# Patient Record
Sex: Male | Born: 1955 | Race: White | Hispanic: No | Smoking: Current every day smoker
Health system: Southern US, Community
[De-identification: ages and names within clinical notes are randomized; demographics above are authoritative.]

## PROBLEM LIST (undated history)

## (undated) DIAGNOSIS — Z86010 Personal history of colonic polyps: Secondary | ICD-10-CM

## (undated) DIAGNOSIS — L309 Dermatitis, unspecified: Secondary | ICD-10-CM

## (undated) DIAGNOSIS — F32A Depression, unspecified: Secondary | ICD-10-CM

## (undated) DIAGNOSIS — Z720 Tobacco use: Secondary | ICD-10-CM

## (undated) DIAGNOSIS — F329 Major depressive disorder, single episode, unspecified: Secondary | ICD-10-CM

## (undated) DIAGNOSIS — E785 Hyperlipidemia, unspecified: Secondary | ICD-10-CM

## (undated) HISTORY — DX: Depression, unspecified: F32.A

## (undated) HISTORY — DX: Major depressive disorder, single episode, unspecified: F32.9

## (undated) HISTORY — DX: Tobacco use: Z72.0

## (undated) HISTORY — DX: Dermatitis, unspecified: L30.9

## (undated) HISTORY — DX: Personal history of colonic polyps: Z86.010

## (undated) HISTORY — DX: Hyperlipidemia, unspecified: E78.5

## (undated) HISTORY — PX: COLONOSCOPY: SHX174

---
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
## 1991-08-02 HISTORY — PX: INGUINAL HERNIA REPAIR: SUR1180

## 2001-02-16 ENCOUNTER — Encounter: Admission: RE | Admit: 2001-02-16 | Discharge: 2001-05-17 | Payer: Self-pay | Admitting: Family Medicine

## 2002-03-26 ENCOUNTER — Encounter: Admission: RE | Admit: 2002-03-26 | Discharge: 2002-03-26 | Payer: Self-pay | Admitting: Family Medicine

## 2002-03-26 ENCOUNTER — Encounter: Payer: Self-pay | Admitting: Family Medicine

## 2002-04-24 ENCOUNTER — Other Ambulatory Visit: Admission: RE | Admit: 2002-04-24 | Discharge: 2002-04-24 | Payer: Self-pay | Admitting: Urology

## 2003-05-13 ENCOUNTER — Encounter: Payer: Self-pay | Admitting: Family Medicine

## 2003-05-13 ENCOUNTER — Encounter: Admission: RE | Admit: 2003-05-13 | Discharge: 2003-05-13 | Payer: Self-pay | Admitting: Family Medicine

## 2004-01-25 ENCOUNTER — Emergency Department (HOSPITAL_COMMUNITY): Admission: EM | Admit: 2004-01-25 | Discharge: 2004-01-25 | Payer: Self-pay | Admitting: Internal Medicine

## 2004-06-17 ENCOUNTER — Ambulatory Visit: Payer: Self-pay | Admitting: Family Medicine

## 2005-03-11 ENCOUNTER — Ambulatory Visit: Payer: Self-pay | Admitting: Family Medicine

## 2005-03-13 ENCOUNTER — Emergency Department (HOSPITAL_COMMUNITY): Admission: EM | Admit: 2005-03-13 | Discharge: 2005-03-13 | Payer: Self-pay | Admitting: Emergency Medicine

## 2005-03-18 ENCOUNTER — Ambulatory Visit: Payer: Self-pay | Admitting: Family Medicine

## 2005-04-01 ENCOUNTER — Ambulatory Visit: Payer: Self-pay | Admitting: Family Medicine

## 2005-05-12 ENCOUNTER — Ambulatory Visit: Payer: Self-pay | Admitting: Family Medicine

## 2005-05-19 ENCOUNTER — Ambulatory Visit: Payer: Self-pay | Admitting: Family Medicine

## 2005-05-27 ENCOUNTER — Encounter: Admission: RE | Admit: 2005-05-27 | Discharge: 2005-05-27 | Payer: Self-pay | Admitting: Family Medicine

## 2005-06-29 ENCOUNTER — Ambulatory Visit: Payer: Self-pay | Admitting: Internal Medicine

## 2005-12-22 ENCOUNTER — Ambulatory Visit: Payer: Self-pay | Admitting: Family Medicine

## 2006-01-31 ENCOUNTER — Ambulatory Visit: Payer: Self-pay | Admitting: Family Medicine

## 2006-02-23 ENCOUNTER — Ambulatory Visit: Payer: Self-pay | Admitting: Family Medicine

## 2006-03-16 ENCOUNTER — Ambulatory Visit: Payer: Self-pay | Admitting: Internal Medicine

## 2006-05-03 ENCOUNTER — Ambulatory Visit: Payer: Self-pay | Admitting: Family Medicine

## 2006-05-09 ENCOUNTER — Ambulatory Visit: Payer: Self-pay | Admitting: Family Medicine

## 2006-05-15 ENCOUNTER — Encounter: Admission: RE | Admit: 2006-05-15 | Discharge: 2006-05-15 | Payer: Self-pay | Admitting: Family Medicine

## 2006-11-23 ENCOUNTER — Ambulatory Visit: Payer: Self-pay | Admitting: Family Medicine

## 2007-03-29 DIAGNOSIS — E785 Hyperlipidemia, unspecified: Secondary | ICD-10-CM

## 2007-03-29 DIAGNOSIS — R319 Hematuria, unspecified: Secondary | ICD-10-CM

## 2007-03-29 DIAGNOSIS — F17200 Nicotine dependence, unspecified, uncomplicated: Secondary | ICD-10-CM

## 2007-05-11 ENCOUNTER — Ambulatory Visit: Payer: Self-pay | Admitting: Family Medicine

## 2007-05-11 LAB — CONVERTED CEMR LAB
ALT: 30 units/L (ref 0–53)
AST: 33 units/L (ref 0–37)
Basophils Relative: 0.8 % (ref 0.0–1.0)
Bilirubin, Direct: 0.2 mg/dL (ref 0.0–0.3)
CO2: 28 meq/L (ref 19–32)
Calcium: 8.6 mg/dL (ref 8.4–10.5)
Chloride: 108 meq/L (ref 96–112)
Eosinophils Absolute: 0.5 10*3/uL (ref 0.0–0.6)
Eosinophils Relative: 6.6 % — ABNORMAL HIGH (ref 0.0–5.0)
GFR calc Af Amer: 102 mL/min
GFR calc non Af Amer: 84 mL/min
Glucose, Bld: 92 mg/dL (ref 70–99)
Glucose, Urine, Semiquant: NEGATIVE
HCT: 43.7 % (ref 39.0–52.0)
Lymphocytes Relative: 23.4 % (ref 12.0–46.0)
MCV: 92.9 fL (ref 78.0–100.0)
Neutro Abs: 4.8 10*3/uL (ref 1.4–7.7)
Neutrophils Relative %: 62.1 % (ref 43.0–77.0)
Nitrite: NEGATIVE
PSA: 1.09 ng/mL (ref 0.10–4.00)
Platelets: 246 10*3/uL (ref 150–400)
Sodium: 141 meq/L (ref 135–145)
Specific Gravity, Urine: 1.015
Total Protein: 6.1 g/dL (ref 6.0–8.3)
VLDL: 14 mg/dL (ref 0–40)
WBC Urine, dipstick: NEGATIVE
WBC: 7.8 10*3/uL (ref 4.5–10.5)
pH: 6

## 2007-05-18 ENCOUNTER — Ambulatory Visit: Payer: Self-pay | Admitting: Family Medicine

## 2007-05-18 DIAGNOSIS — D239 Other benign neoplasm of skin, unspecified: Secondary | ICD-10-CM | POA: Insufficient documentation

## 2007-05-21 ENCOUNTER — Telehealth: Payer: Self-pay | Admitting: Family Medicine

## 2007-05-22 ENCOUNTER — Telehealth: Payer: Self-pay | Admitting: Family Medicine

## 2007-07-31 ENCOUNTER — Ambulatory Visit: Payer: Self-pay | Admitting: Family Medicine

## 2007-07-31 DIAGNOSIS — F4321 Adjustment disorder with depressed mood: Secondary | ICD-10-CM | POA: Insufficient documentation

## 2007-07-31 DIAGNOSIS — M25519 Pain in unspecified shoulder: Secondary | ICD-10-CM

## 2007-07-31 DIAGNOSIS — K210 Gastro-esophageal reflux disease with esophagitis: Secondary | ICD-10-CM

## 2007-08-15 ENCOUNTER — Ambulatory Visit: Payer: Self-pay | Admitting: Family Medicine

## 2007-09-26 ENCOUNTER — Telehealth (INDEPENDENT_AMBULATORY_CARE_PROVIDER_SITE_OTHER): Payer: Self-pay | Admitting: *Deleted

## 2007-10-12 ENCOUNTER — Ambulatory Visit: Payer: Self-pay | Admitting: Internal Medicine

## 2007-10-26 ENCOUNTER — Ambulatory Visit: Payer: Self-pay | Admitting: Internal Medicine

## 2007-10-26 ENCOUNTER — Encounter: Payer: Self-pay | Admitting: Internal Medicine

## 2007-10-26 ENCOUNTER — Encounter: Payer: Self-pay | Admitting: Family Medicine

## 2008-02-06 ENCOUNTER — Encounter: Payer: Self-pay | Admitting: Family Medicine

## 2008-07-03 ENCOUNTER — Ambulatory Visit: Payer: Self-pay | Admitting: Family Medicine

## 2008-07-03 LAB — CONVERTED CEMR LAB
ALT: 22 units/L (ref 0–53)
AST: 20 units/L (ref 0–37)
Albumin: 3.8 g/dL (ref 3.5–5.2)
Alkaline Phosphatase: 56 units/L (ref 39–117)
BUN: 11 mg/dL (ref 6–23)
Basophils Absolute: 0 10*3/uL (ref 0.0–0.1)
Basophils Relative: 0.1 % (ref 0.0–3.0)
Bilirubin, Direct: 0.1 mg/dL (ref 0.0–0.3)
CO2: 32 meq/L (ref 19–32)
Calcium: 9.3 mg/dL (ref 8.4–10.5)
Chloride: 110 meq/L (ref 96–112)
Cholesterol: 167 mg/dL (ref 0–200)
Creatinine, Ser: 0.9 mg/dL (ref 0.4–1.5)
Eosinophils Absolute: 0.5 10*3/uL (ref 0.0–0.7)
Eosinophils Relative: 6.3 % — ABNORMAL HIGH (ref 0.0–5.0)
GFR calc Af Amer: 115 mL/min
GFR calc non Af Amer: 95 mL/min
Glucose, Bld: 92 mg/dL (ref 70–99)
HCT: 46.9 % (ref 39.0–52.0)
HDL: 41.4 mg/dL (ref >39.0)
Hemoglobin: 16.2 g/dL (ref 13.0–17.0)
LDL Cholesterol: 111 mg/dL — ABNORMAL HIGH (ref 0–99)
Lymphocytes Relative: 26.1 % (ref 12.0–46.0)
MCHC: 34.6 g/dL (ref 30.0–36.0)
MCV: 94.6 fL (ref 78.0–100.0)
Monocytes Absolute: 0.5 10*3/uL (ref 0.1–1.0)
Monocytes Relative: 6.8 % (ref 3.0–12.0)
Neutro Abs: 4.9 10*3/uL (ref 1.4–7.7)
Neutrophils Relative %: 60.7 % (ref 43.0–77.0)
PSA: 1.51 ng/mL (ref 0.10–4.00)
Platelets: 216 10*3/uL (ref 150–400)
Potassium: 4.5 meq/L (ref 3.5–5.1)
Protein, U semiquant: NEGATIVE
RBC: 4.96 M/uL (ref 4.22–5.81)
RDW: 12.3 % (ref 11.5–14.6)
Sodium: 146 meq/L — ABNORMAL HIGH (ref 135–145)
TSH: 1 microintl units/mL (ref 0.35–5.50)
Total Bilirubin: 0.8 mg/dL (ref 0.3–1.2)
Total CHOL/HDL Ratio: 4
Total Protein: 6.8 g/dL (ref 6.0–8.3)
Triglycerides: 75 mg/dL (ref 0–149)
Urobilinogen, UA: 0.2
VLDL: 15 mg/dL (ref 0–40)
WBC Urine, dipstick: NEGATIVE
WBC: 8 10*3/uL (ref 4.5–10.5)

## 2008-07-15 ENCOUNTER — Ambulatory Visit: Payer: Self-pay | Admitting: Family Medicine

## 2008-07-28 ENCOUNTER — Ambulatory Visit: Payer: Self-pay | Admitting: Family Medicine

## 2008-10-03 ENCOUNTER — Encounter (INDEPENDENT_AMBULATORY_CARE_PROVIDER_SITE_OTHER): Payer: Self-pay | Admitting: *Deleted

## 2008-11-10 ENCOUNTER — Ambulatory Visit: Payer: Self-pay | Admitting: Internal Medicine

## 2008-11-24 ENCOUNTER — Ambulatory Visit: Payer: Self-pay | Admitting: Internal Medicine

## 2008-11-24 ENCOUNTER — Encounter: Payer: Self-pay | Admitting: Internal Medicine

## 2008-11-25 ENCOUNTER — Encounter: Payer: Self-pay | Admitting: Internal Medicine

## 2009-07-20 ENCOUNTER — Ambulatory Visit: Payer: Self-pay | Admitting: Family Medicine

## 2009-07-20 LAB — CONVERTED CEMR LAB
ALT: 24 units/L (ref 0–53)
Alkaline Phosphatase: 59 units/L (ref 39–117)
Basophils Relative: 0.2 % (ref 0.0–3.0)
Bilirubin, Direct: 0 mg/dL (ref 0.0–0.3)
Calcium: 9.7 mg/dL (ref 8.4–10.5)
Creatinine, Ser: 1 mg/dL (ref 0.4–1.5)
Eosinophils Relative: 7.4 % — ABNORMAL HIGH (ref 0.0–5.0)
HDL: 38.8 mg/dL — ABNORMAL LOW (ref >39.00)
Ketones, urine, test strip: NEGATIVE
LDL Cholesterol: 101 mg/dL — ABNORMAL HIGH (ref 0–99)
Lymphocytes Relative: 27.7 % (ref 12.0–46.0)
Monocytes Relative: 7.6 % (ref 3.0–12.0)
Neutrophils Relative %: 57.1 % (ref 43.0–77.0)
Nitrite: NEGATIVE
RBC: 4.95 M/uL (ref 4.22–5.81)
Total CHOL/HDL Ratio: 4
Total Protein: 6.9 g/dL (ref 6.0–8.3)
Triglycerides: 116 mg/dL (ref 0.0–149.0)
Urobilinogen, UA: 0.2
WBC Urine, dipstick: NEGATIVE
WBC: 8 10*3/uL (ref 4.5–10.5)

## 2009-07-23 ENCOUNTER — Ambulatory Visit: Payer: Self-pay | Admitting: Family Medicine

## 2010-03-22 ENCOUNTER — Telehealth: Payer: Self-pay | Admitting: Family Medicine

## 2010-08-05 ENCOUNTER — Other Ambulatory Visit: Payer: Self-pay | Admitting: Family Medicine

## 2010-08-05 ENCOUNTER — Ambulatory Visit
Admission: RE | Admit: 2010-08-05 | Discharge: 2010-08-05 | Payer: Self-pay | Source: Home / Self Care | Attending: Family Medicine | Admitting: Family Medicine

## 2010-08-05 LAB — CBC WITH DIFFERENTIAL/PLATELET
Basophils Absolute: 0.1 10*3/uL (ref 0.0–0.1)
Basophils Relative: 0.5 % (ref 0.0–3.0)
Eosinophils Absolute: 0.4 10*3/uL (ref 0.0–0.7)
Eosinophils Relative: 3.6 % (ref 0.0–5.0)
HCT: 47.6 % (ref 39.0–52.0)
Hemoglobin: 16.4 g/dL (ref 13.0–17.0)
Lymphocytes Relative: 21.9 % (ref 12.0–46.0)
Lymphs Abs: 2.4 10*3/uL (ref 0.7–4.0)
MCHC: 34.5 g/dL (ref 30.0–36.0)
MCV: 93.4 fl (ref 78.0–100.0)
Monocytes Absolute: 0.7 10*3/uL (ref 0.1–1.0)
Monocytes Relative: 6 % (ref 3.0–12.0)
Neutro Abs: 7.5 10*3/uL (ref 1.4–7.7)
Neutrophils Relative %: 68 % (ref 43.0–77.0)
Platelets: 219 10*3/uL (ref 150.0–400.0)
RBC: 5.1 Mil/uL (ref 4.22–5.81)
RDW: 13.3 % (ref 11.5–14.6)
WBC: 11.1 10*3/uL — ABNORMAL HIGH (ref 4.5–10.5)

## 2010-08-05 LAB — LIPID PANEL
Cholesterol: 177 mg/dL (ref 0–200)
HDL: 37.9 mg/dL — ABNORMAL LOW (ref >39.00)
LDL Cholesterol: 124 mg/dL — ABNORMAL HIGH (ref 0–99)
Total CHOL/HDL Ratio: 5
Triglycerides: 78 mg/dL (ref 0.0–149.0)
VLDL: 15.6 mg/dL (ref 0.0–40.0)

## 2010-08-05 LAB — BASIC METABOLIC PANEL
BUN: 14 mg/dL (ref 6–23)
CO2: 29 mEq/L (ref 19–32)
Calcium: 8.9 mg/dL (ref 8.4–10.5)
Chloride: 103 mEq/L (ref 96–112)
Creatinine, Ser: 1.1 mg/dL (ref 0.4–1.5)
GFR: 78.56 mL/min (ref >60.00)
Glucose, Bld: 96 mg/dL (ref 70–99)
Potassium: 4.4 mEq/L (ref 3.5–5.1)
Sodium: 138 mEq/L (ref 135–145)

## 2010-08-05 LAB — CONVERTED CEMR LAB
Glucose, Urine, Semiquant: NEGATIVE
Nitrite: NEGATIVE
Protein, U semiquant: NEGATIVE
WBC Urine, dipstick: NEGATIVE
pH: 5

## 2010-08-05 LAB — HEPATIC FUNCTION PANEL
ALT: 19 U/L (ref 0–53)
AST: 21 U/L (ref 0–37)
Albumin: 3.8 g/dL (ref 3.5–5.2)
Alkaline Phosphatase: 63 U/L (ref 39–117)
Bilirubin, Direct: 0.1 mg/dL (ref 0.0–0.3)
Total Bilirubin: 1.1 mg/dL (ref 0.3–1.2)
Total Protein: 6.6 g/dL (ref 6.0–8.3)

## 2010-08-05 LAB — TSH: TSH: 1.05 u[IU]/mL (ref 0.35–5.50)

## 2010-08-05 LAB — PSA: PSA: 1.12 ng/mL (ref 0.10–4.00)

## 2010-08-12 ENCOUNTER — Encounter: Payer: Self-pay | Admitting: Family Medicine

## 2010-08-12 ENCOUNTER — Ambulatory Visit
Admission: RE | Admit: 2010-08-12 | Discharge: 2010-08-12 | Payer: Self-pay | Source: Home / Self Care | Attending: Family Medicine | Admitting: Family Medicine

## 2010-08-19 ENCOUNTER — Encounter: Payer: Self-pay | Admitting: Family Medicine

## 2010-08-19 ENCOUNTER — Other Ambulatory Visit: Payer: Self-pay | Admitting: Family Medicine

## 2010-08-19 ENCOUNTER — Ambulatory Visit
Admission: RE | Admit: 2010-08-19 | Discharge: 2010-08-19 | Payer: Self-pay | Source: Home / Self Care | Attending: Family Medicine | Admitting: Family Medicine

## 2010-08-31 NOTE — Progress Notes (Signed)
Summary: Pt req to get a referral to Orthopedic spec for knee pain  Phone Note Call from Patient Call back at Home Phone [phone]   Caller: Patient Summary of Call: Pt called and said that he is having problems with his knee. Knee is swollen and bruise. Pt is req to get a referral to Orthopedic doctor. Pls advise.  Pt says he is in a lot of pain.    Initial call taken by: Lucy Antigua,  March 22, 2010 9:14 AM  Follow-up for Phone Call        call [facility].........Marland KitchenDr. Cleophas Dunker, and cafffrey........ and requests to be seen today.  Tell him to use my name .......... Follow-up by: Roderick Pee MD,  March 22, 2010 10:25 AM  Additional Follow-up for Phone Call Additional follow up Details #1::        spoke with patient  Additional Follow-up by: Kern Reap CMA Duncan Dull),  March 22, 2010 11:19 AM

## 2010-09-02 NOTE — Miscellaneous (Signed)
Summary: Consent to Lesion Removal  Consent to Lesion Removal   Imported By: Maryln Gottron 08/26/2010 13:12:24  _____________________________________________________________________  External Attachment:    Type:   Image     Comment:   External Document

## 2010-09-02 NOTE — Assessment & Plan Note (Signed)
Summary: lesion removal also pfts/mm   Procedure Note Last Tetanus: Tdap (07/23/2009)  Mole Biopsy/Removal: Indication: rule out cancer  Procedure # 1: elliptical incision with 2 mm margin    Size (in cm): 1.0 x 1.0    Region: posterior    Location: neck-left    Instrument used: #15 blade    Anesthesia: 1% lidocaine w/epinephrine    Closure: cautery  Cleaned and prepped with: alcohol Wound dressing: bandaid   History of Present Illness: Bryan Lambert is a 55 year old male, who comes in today for removal of a lesion on his left scalp at the hairline behind his left ear.  This nonhealing.  Will also going to do PFTs because he is a smoker  Diabetes Management History:      He says that he is exercising.    Allergies: 1)  ! Ibuprofen (Ibuprofen) 2)  ! * Almonds   Complete Medication List: 1)  Simvastatin 20 Mg Tabs (Simvastatin) .Marland Kitchen.. 1 tab by mouth at bedtime 2)  Aspirin 325 Mg Tbec (Aspirin) .... Once daily 3)  Epipen Jr 2-pak 0.15 Mg/0.53ml (1:2000) Devi (Epinephrine hcl (anaphylaxis)) .... As needed 4)  Celexa 20 Mg Tabs (Citalopram hydrobromide) .Marland Kitchen.. 1 tab @ bedtime 5)  Hydromet 5-1.5 Mg/61ml Syrp (Hydrocodone-homatropine) .Marland Kitchen.. 1 or 2 tsps three times a day as needed 6)  Chantix Continuing Month Pak 1 Mg Tabs (Varenicline tartrate) .... Uad  Other Orders: Spirometry w/Graph (94010) Shave Skin Lesion 0.6-1.0cm scalp/neck/hands/feet/genitalia ([address])   Orders Added: 1)  Spirometry w/Graph [94010] 2)  Shave Skin Lesion 0.6-1.0cm scalp/neck/hands/feet/genitalia [60454]

## 2010-09-02 NOTE — Assessment & Plan Note (Signed)
Summary: cpx/njr   Vital Signs:  Patient profile:   54 year old male Height:      68 inches Weight:      198 pounds BMI:     30.21 Temp:     98.0 degrees F oral BP sitting:   120 / 78  (right arm) Cuff size:   regular  Vitals Entered By: Romualdo Bolk, CMA (AAMA) (August 12, 2010 9:33 AM) CC: CPX   CC:  CPX.  History of Present Illness: Bryan Lambert is a 55 year old, divorced male, smoker, who comes in today for general physical examination because of tobacco abuse, hyperlipidemia, mild depression.  He takes simvastatin 20 mg nightly for hyperlipidemia, and is on an aspirin tablet daily.  The posterior ago.  Continue current medication.  He also takes Celexa 20 mg at bedtime for mild depression feeling well.  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  He wants to know what to take.  Advise Motrin 400 mg b.i.d.  He also has some symptoms of reflux esophagitis.  He is able to swallow otherwise asymptomatic.  He also has hematuria.  Urologic workup by Dr. Hilary Hertz was normal.  Recent urinalysis and PSA were normal.  He has a lesion on the left side of his neck that won't heal it is crusted.  We talked in detail about smoking cessation.  He did try the chantix once for a couple months.  He is willing to try again.  Tetanus booster 2010.  Preventive Screening-Counseling & Management  Alcohol-Tobacco     Smoking Status: current     Smoking Cessation Counseling: yes     Packs/Day: 1  Caffeine-Diet-Exercise     Does Patient Exercise: yes  Current Medications (verified): 1)  Simvastatin 20 Mg  Tabs (Simvastatin) .Marland Kitchen.. 1 Tab By Mouth At Bedtime 2)  Aspirin 325 Mg  Tbec (Aspirin) .... Once Daily 3)  Epipen Jr 2-Pak 0.15 Mg/0.49ml (1:2000)  Devi (Epinephrine Hcl (Anaphylaxis)) .... As Needed 4)  Celexa 20 Mg  Tabs (Citalopram Hydrobromide) .Marland Kitchen.. 1 Tab @ Bedtime 5)  Hydromet 5-1.5 Mg/61ml Syrp (Hydrocodone-Homatropine) .Marland Kitchen.. 1 or 2 Tsps Three Times A Day As  Needed  Allergies (verified): 1)  ! Ibuprofen (Ibuprofen) 2)  ! * Almonds  Past History:  Past medical, surgical, family and social histories (including risk factors) reviewed, and no changes noted (except as noted below).  Past Medical History: Reviewed history from 07/15/2008 and no changes required. Hyperlipidemia asymptomatic hematuria eczema tobacco abuse Depression  Past Surgical History: Reviewed history from 07/15/2008 and no changes required. Lumbar laminectomy  Family History: Reviewed history and no changes required.  Social History: Reviewed history from 05/18/2007 and no changes required. Single Divorced Current Smoker Alcohol use-yes Drug use-no Regular exercise-yes  Review of Systems      See HPI  Physical Exam  General:  Well-developed,well-nourished,in no acute distress; alert,appropriate and cooperative throughout examination Head:  Normocephalic and atraumatic without obvious abnormalities. No apparent alopecia or balding. Eyes:  No corneal or conjunctival inflammation noted. EOMI. Perrla. Funduscopic exam benign, without hemorrhages, exudates or papilledema. Vision grossly normal. Ears:  External ear exam shows no significant lesions or deformities.  Otoscopic examination reveals clear canals, tympanic membranes are intact bilaterally without bulging, retraction, inflammation or discharge. Hearing is grossly normal bilaterally. Nose:  External nasal examination shows no deformity or inflammation. Nasal mucosa are pink and moist without lesions or exudates. Mouth:  Oral mucosa and oropharynx without lesions or exudates.  Teeth in good repair.  Neck:  No deformities, masses, or tenderness noted. Chest Wall:  No deformities, masses, tenderness or gynecomastia noted. Breasts:  No masses or gynecomastia noted Lungs:  Normal respiratory effort, chest expands symmetrically. Lungs are clear to auscultation, no crackles or wheezes. Heart:  Normal rate and  regular rhythm. S1 and S2 normal without gallop, murmur, click, rub or other extra sounds. Abdomen:  Bowel sounds positive,abdomen soft and non-tender without masses, organomegaly or hernias noted. Rectal:  uro 1/12 Genitalia:  Testes bilaterally descended without nodularity, tenderness or masses. No scrotal masses or lesions. No penis lesions or urethral discharge. Prostate:  uro 1/12 Msk:  No deformity or scoliosis noted of thoracic or lumbar spine.   Pulses:  R and L carotid,radial,femoral,dorsalis pedis and posterior tibial pulses are full and equal bilaterally Extremities:  No clubbing, cyanosis, edema, or deformity noted with normal full range of motion of all joints.   Neurologic:  No cranial nerve deficits noted. Station and gait are normal. Plantar reflexes are down-going bilaterally. DTRs are symmetrical throughout. Sensory, motor and coordinative functions appear intact. Skin:  Intact without suspicious lesions or rashes Cervical Nodes:  No lymphadenopathy noted Axillary Nodes:  No palpable lymphadenopathy Inguinal Nodes:  No significant adenopathy Psych:  Cognition and judgment appear intact. Alert and cooperative with normal attention span and concentration. No apparent delusions, illusions, hallucinations   Impression & Recommendations:  Problem # 1:  PHYSICAL EXAMINATION (ICD-V70.0) Assessment Unchanged  Orders: Tobacco use cessation intensive >10 minutes (61950)  Problem # 2:  TOBACCO ABUSE (ICD-305.1) Assessment: Unchanged  The following medications were removed from the medication list:    Chantix Starting Month Pak 0.5 Mg X 11 & 1 Mg X 42 Tabs (Varenicline tartrate) ..... Uad    Chantix Continuing Month Pak 1 Mg Tabs (Varenicline tartrate) ..... Uad His updated medication list for this problem includes:    Chantix Continuing Month Pak 1 Mg Tabs (Varenicline tartrate) ..... Uad  Orders: Tobacco use cessation intensive >10 minutes (93267)  Problem # 3:   HYPERLIPIDEMIA (ICD-272.4) Assessment: Improved  His updated medication list for this problem includes:    Simvastatin 20 Mg Tabs (Simvastatin) .Marland Kitchen... 1 tab by mouth at bedtime  Orders: Tobacco use cessation intensive >10 minutes (12458)  Problem # 4:  HEMATURIA, MICROSCOPIC (ICD-599.7) Assessment: Unchanged  Complete Medication List: 1)  Simvastatin 20 Mg Tabs (Simvastatin) .Marland Kitchen.. 1 tab by mouth at bedtime 2)  Aspirin 325 Mg Tbec (Aspirin) .... Once daily 3)  Epipen Jr 2-pak 0.15 Mg/0.14ml (1:2000) Devi (Epinephrine hcl (anaphylaxis)) .... As needed 4)  Celexa 20 Mg Tabs (Citalopram hydrobromide) .Marland Kitchen.. 1 tab @ bedtime 5)  Hydromet 5-1.5 Mg/74ml Syrp (Hydrocodone-homatropine) .Marland Kitchen.. 1 or 2 tsps three times a day as needed 6)  Chantix Continuing Month Pak 1 Mg Tabs (Varenicline tartrate) .... Uad  Patient Instructions: 1)  begin chantix one half tablet every morning, and begin a tapering program......... as follows....... 20........ 15........Marland Kitchen 10............ 5........ 4.......... 3......... 2......... one............ zero!!!!!!!!!!!!!!!. 2)  Return next week for removal of the lesion.  On your scalp, also at that time, we will do some pulmonary function studies. 3)  It is important that you exercise regularly at least 20 minutes 5 times a week. If you develop chest pain, have severe difficulty breathing, or feel very tired , stop exercising immediately and seek medical attention. 4)  Schedule a colonoscopy/sigmoidoscopy to help detect colon cancer. 5)  Take an Aspirin every day. Prescriptions: CELEXA 20 MG  TABS (CITALOPRAM HYDROBROMIDE) 1 tab @  bedtime  #100 x 3   Entered and Authorized by:   Roderick Pee MD   Signed by:   Roderick Pee MD on 08/12/2010   Method used:   Print then Give to Patient   RxID:   [id number] EPIPEN JR 2-PAK 0.15 MG/0.3ML (1:2000)  DEVI (EPINEPHRINE HCL (ANAPHYLAXIS)) as needed  #1 x 1   Entered and Authorized by:   Roderick Pee MD   Signed by:   Roderick Pee MD on 08/12/2010   Method used:   Print then Give to Patient   RxID:   [id number] SIMVASTATIN 20 MG  TABS (SIMVASTATIN) 1 tab by mouth at bedtime  #100 x 3   Entered and Authorized by:   Roderick Pee MD   Signed by:   Roderick Pee MD on 08/12/2010   Method used:   Print then Give to Patient   RxID:   [id number] CHANTIX CONTINUING MONTH PAK 1 MG TABS (VARENICLINE TARTRATE) UAD  #1 x 4   Entered and Authorized by:   Roderick Pee MD   Signed by:   Roderick Pee MD on 08/12/2010   Method used:   Print then Give to Patient   RxID:   [id number]    Orders Added: 1)  Est. Patient 40-64 years [84166] 2)  Tobacco use cessation intensive >10 minutes [99407]

## 2010-09-22 ENCOUNTER — Ambulatory Visit: Payer: Self-pay | Admitting: Family Medicine

## 2011-03-15 ENCOUNTER — Ambulatory Visit (INDEPENDENT_AMBULATORY_CARE_PROVIDER_SITE_OTHER): Payer: BC Managed Care – PPO | Admitting: Family Medicine

## 2011-03-15 ENCOUNTER — Encounter: Payer: Self-pay | Admitting: Family Medicine

## 2011-03-15 VITALS — BP 120/80 | Temp 98.4°F | Wt 194.0 lb

## 2011-03-15 DIAGNOSIS — D239 Other benign neoplasm of skin, unspecified: Secondary | ICD-10-CM

## 2011-03-15 DIAGNOSIS — D485 Neoplasm of uncertain behavior of skin: Secondary | ICD-10-CM

## 2011-03-15 DIAGNOSIS — L570 Actinic keratosis: Secondary | ICD-10-CM

## 2011-03-15 DIAGNOSIS — L720 Epidermal cyst: Secondary | ICD-10-CM

## 2011-03-15 DIAGNOSIS — L989 Disorder of the skin and subcutaneous tissue, unspecified: Secondary | ICD-10-CM

## 2011-03-15 NOTE — Progress Notes (Signed)
  Subjective:    Patient ID: Bryan Lambert, male    DOB: 1955/11/25, 55 y.o.   MRN: [id number]  HPI Bryan Lambert is a 55 year old, divorced male, who comes in today for removal of two lesions on his neck.  About a year ago he noticed two lesions on the left side of his neck.  They do not gone away.  He comes in today for removal.  Lesion number one is a 10 mm x 10 mm lesion with a central divot.  Lesion number two is also 10 mm x 10 mm red, irritated.  After verbal in form consent both lesions were anesthetized removed with 3-mm margins and sent for pathologic analysis.  At the bases were cauterized.  Bandage was applied.  Tolerated the procedure well.  No complications.   Review of Systems Negative    Objective:   Physical Exam  Procedure see above     Assessment & Plan:  Lesions removed x 2.  Neck probable basal cell carcinomas.  Await path

## 2011-10-17 ENCOUNTER — Encounter: Payer: Self-pay | Admitting: Internal Medicine

## 2011-11-07 ENCOUNTER — Other Ambulatory Visit (INDEPENDENT_AMBULATORY_CARE_PROVIDER_SITE_OTHER): Payer: BC Managed Care – PPO

## 2011-11-07 ENCOUNTER — Other Ambulatory Visit: Payer: Self-pay | Admitting: *Deleted

## 2011-11-07 DIAGNOSIS — Z0000 Encounter for general adult medical examination without abnormal findings: Secondary | ICD-10-CM

## 2011-11-07 LAB — POCT URINALYSIS DIPSTICK
Bilirubin, UA: NEGATIVE
Ketones, UA: NEGATIVE
Leukocytes, UA: NEGATIVE
Nitrite, UA: NEGATIVE

## 2011-11-07 LAB — CBC WITH DIFFERENTIAL/PLATELET
Basophils Relative: 0.7 % (ref 0.0–3.0)
Eosinophils Relative: 5.6 % — ABNORMAL HIGH (ref 0.0–5.0)
HCT: 45.4 % (ref 39.0–52.0)
Hemoglobin: 15.5 g/dL (ref 13.0–17.0)
Lymphs Abs: 2 10*3/uL (ref 0.7–4.0)
MCV: 95 fl (ref 78.0–100.0)
Monocytes Absolute: 0.5 10*3/uL (ref 0.1–1.0)
Neutro Abs: 4.3 10*3/uL (ref 1.4–7.7)
Platelets: 218 10*3/uL (ref 150.0–400.0)
WBC: 7.3 10*3/uL (ref 4.5–10.5)

## 2011-11-07 LAB — LIPID PANEL: Cholesterol: 164 mg/dL (ref 0–200)

## 2011-11-07 LAB — BASIC METABOLIC PANEL
BUN: 14 mg/dL (ref 6–23)
Chloride: 109 mEq/L (ref 96–112)
Potassium: 4.7 mEq/L (ref 3.5–5.1)

## 2011-11-07 LAB — HEPATIC FUNCTION PANEL
ALT: 18 U/L (ref 0–53)
Albumin: 4 g/dL (ref 3.5–5.2)
Total Protein: 6.3 g/dL (ref 6.0–8.3)

## 2011-11-07 LAB — TSH: TSH: 0.71 u[IU]/mL (ref 0.35–5.50)

## 2011-11-07 LAB — PSA: PSA: 1.05 ng/mL (ref 0.10–4.00)

## 2011-11-07 MED ORDER — CITALOPRAM HYDROBROMIDE 20 MG PO TABS: 20.0000 mg | ORAL_TABLET | Freq: Every day | ORAL | Status: DC

## 2011-11-07 MED ORDER — SIMVASTATIN 20 MG PO TABS: 20.0000 mg | ORAL_TABLET | Freq: Every day | ORAL | Status: DC

## 2011-11-14 ENCOUNTER — Encounter: Payer: BC Managed Care – PPO | Admitting: Family Medicine

## 2011-11-28 ENCOUNTER — Encounter: Payer: Self-pay | Admitting: Internal Medicine

## 2011-12-27 ENCOUNTER — Ambulatory Visit (AMBULATORY_SURGERY_CENTER): Payer: BC Managed Care – PPO | Admitting: *Deleted

## 2011-12-27 VITALS — Ht 68.0 in | Wt 192.2 lb

## 2011-12-27 DIAGNOSIS — Z1211 Encounter for screening for malignant neoplasm of colon: Secondary | ICD-10-CM

## 2011-12-27 MED ORDER — PEG-KCL-NACL-NASULF-NA ASC-C 100 G PO SOLR: ORAL | Status: DC

## 2011-12-28 ENCOUNTER — Encounter: Payer: Self-pay | Admitting: Internal Medicine

## 2011-12-29 ENCOUNTER — Encounter: Payer: BC Managed Care – PPO | Admitting: Family Medicine

## 2012-01-10 ENCOUNTER — Ambulatory Visit (AMBULATORY_SURGERY_CENTER): Payer: BC Managed Care – PPO | Admitting: Internal Medicine

## 2012-01-10 ENCOUNTER — Encounter: Payer: Self-pay | Admitting: Internal Medicine

## 2012-01-10 VITALS — BP 124/74 | HR 53 | Temp 97.5°F | Resp 16 | Ht 68.0 in | Wt 192.0 lb

## 2012-01-10 DIAGNOSIS — Z860100 Personal history of colon polyps, unspecified: Secondary | ICD-10-CM

## 2012-01-10 DIAGNOSIS — D126 Benign neoplasm of colon, unspecified: Secondary | ICD-10-CM

## 2012-01-10 DIAGNOSIS — Z1211 Encounter for screening for malignant neoplasm of colon: Secondary | ICD-10-CM

## 2012-01-10 HISTORY — DX: Personal history of colonic polyps: Z86.010

## 2012-01-10 MED ORDER — SODIUM CHLORIDE 0.9 % IV SOLN: 500.0000 mL | INTRAVENOUS | Status: DC

## 2012-01-10 NOTE — Progress Notes (Addendum)
Pt has frequent, dry cough in the RRPatient did not experience any of the following events: a burn prior to discharge; a fall within the facility; wrong site/side/patient/procedure/implant event; or a hospital transfer or hospital admission upon discharge from the facility. [phone]) Patient did not have preoperative order for IV antibiotic SSI prophylaxis. [phone])

## 2012-01-10 NOTE — Op Note (Signed)
[facility] [address] [place], [place]  [address]  COLONOSCOPY PROCEDURE REPORT  PATIENT:  Bryan Lambert, Bryan Lambert  MR#:  [id number] BIRTHDATE:  Jun 25, 1956, 56 yrs. old  GENDER:  male ENDOSCOPIST:  Iva Boop, MD, [facility]  PROCEDURE DATE:  01/10/2012 PROCEDURE:  Colonoscopy with snare polypectomy ASA CLASS:  Class II INDICATIONS:  surveillance and high-risk screening, history of pre-cancerous (adenomatous) colon polyps 13 polyps 2009 - serrated adenomas, adenomas, hyperplastic 2 polyps 2010 - diminutive hyperplastic MEDICATIONS:   These medications were titrated to patient response per physician's verbal order, MAC sedation, administered by CRNA, propofol (Diprivan) 400 mg IV  DESCRIPTION OF PROCEDURE:   After the risks benefits and alternatives of the procedure were thoroughly explained, informed consent was obtained.  Digital rectal exam was performed and revealed no abnormalities and normal prostate.   The LB [id number] endoscope was introduced through the anus and advanced to the cecum, which was identified by both the appendix and ileocecal valve, without limitations.  The quality of the prep was excellent, using MoviPrep.  The instrument was then slowly withdrawn as the colon was fully examined. <<PROCEDUREIMAGES>>  FINDINGS:  There were multiple polyps identified and removed. 11 polyps removed, 8 recovered. size 15 mm down to diminutive. Cold snare and snare cautery removal (12 and 15 mm polyps). All polyps in sigmoid or rectum.  Mild diverticulosis was found in the sigmoid colon.  This was otherwise a normal examination of the colon. Includes right colon retroflexion.   Retroflexed views in the rectum revealed internal hemorrhoids.    The time to cecum = 3:55 minutes. The scope was then withdrawn in 21:45 minutes from the cecum and the procedure completed. COMPLICATIONS:  None ENDOSCOPIC IMPRESSION: 1) Polyps, 11 removed (8 recovered) 2) Mild  diverticulosis in the sigmoid colon 3) Internal hemorrhoids 4) Otherwise normal examination, excellent prep 5Personal hx multiple adenomas 2009 RECOMMENDATIONS: 1) Hold aspirin, aspirin products, and anti-inflammatory medication for 2 weeks. REPEAT EXAM:  In for Colonoscopy, pending biopsy results.  Iva Boop, MD, [facility]  CC:  The Patient  n. eSIGNED:   Iva Boop at 01/10/2012 12:19 PM  Markham Jordan, [phone]

## 2012-01-10 NOTE — Patient Instructions (Addendum)

## 2012-01-11 ENCOUNTER — Telehealth: Payer: Self-pay | Admitting: *Deleted

## 2012-01-11 NOTE — Telephone Encounter (Signed)
Message left for patient

## 2012-01-13 ENCOUNTER — Telehealth: Payer: Self-pay | Admitting: Family Medicine

## 2012-01-13 NOTE — Telephone Encounter (Signed)
Pt called and is sch for cpx with pcp on 03/01/12, but pt has rcvd a jury summons to report for jury duty on 03/01/12. Pt didn't know if Dr Tawanna Cooler could write a excuse for jury duty, or will pt need to rsc his cpx?

## 2012-01-16 ENCOUNTER — Encounter: Payer: Self-pay | Admitting: Internal Medicine

## 2012-01-16 NOTE — Telephone Encounter (Signed)
Resch. cpx

## 2012-01-16 NOTE — Telephone Encounter (Signed)
Called pt and rsc cpx for 04/17/12 at 10:30am as noted.

## 2012-01-17 ENCOUNTER — Encounter: Payer: Self-pay | Admitting: Internal Medicine

## 2012-01-17 NOTE — Progress Notes (Signed)
Quick Note:  11 polyps, 8 recovered - all hyperplastic  Numerous polyps over time Repeat colonoscopy 1 year ______

## 2012-01-31 ENCOUNTER — Encounter: Payer: Self-pay | Admitting: Family

## 2012-01-31 ENCOUNTER — Ambulatory Visit (INDEPENDENT_AMBULATORY_CARE_PROVIDER_SITE_OTHER): Payer: BC Managed Care – PPO | Admitting: Family

## 2012-01-31 VITALS — BP 120/80 | Temp 98.8°F | Wt 192.0 lb

## 2012-01-31 DIAGNOSIS — M79602 Pain in left arm: Secondary | ICD-10-CM

## 2012-01-31 DIAGNOSIS — M5412 Radiculopathy, cervical region: Secondary | ICD-10-CM

## 2012-01-31 DIAGNOSIS — M79609 Pain in unspecified limb: Secondary | ICD-10-CM

## 2012-01-31 MED ORDER — PREDNISONE 20 MG PO TABS: ORAL_TABLET | ORAL | Status: AC

## 2012-01-31 NOTE — Progress Notes (Signed)
Subjective:    Patient ID: Bryan Lambert, male    DOB: March 14, 1956, 56 y.o.   MRN: [id number]  HPI 56 year old white male, smoker, patient of Dr. Tawanna Cooler is in with complaints of neck pain and left arm pain x1 week. He describes it as a dull ache with a spasm in his upper back and chest. His intake and ibuprofen 2-4 a day to help. He's also been applying ice to help. Patient refuses pain 6/10. Worse with movement.   Review of Systems  Constitutional: Negative.   Respiratory: Negative.  Negative for chest tightness and shortness of breath.   Cardiovascular: Negative.  Negative for chest pain, palpitations and leg swelling.  Musculoskeletal: Negative for myalgias and joint swelling.       Mild neck pain  Skin: Negative.   Neurological: Negative.        Pain that radiates down the left arm and stops at the antecubital  Hematological: Negative.   Psychiatric/Behavioral: Negative.    Past Medical History  Diagnosis Date  . Hyperlipidemia   . Hematuria     asymptomatic   . Eczema   . Tobacco abuse   . Depression   . Personal history of colonic polyps 01/10/2012    History   Social History  . Marital Status: Divorced    Spouse Name: N/A    Number of Children: N/A  . Years of Education: N/A   Occupational History  . Not on file.   Social History Main Topics  . Smoking status: Current Everyday Smoker -- 1.0 packs/day    Types: Cigarettes  . Smokeless tobacco: Never Used  . Alcohol Use: 1.2 oz/week    2 Cans of beer per week  . Drug Use: No  . Sexually Active: Not on file   Other Topics Concern  . Not on file   Social History Narrative   SingleDivorcedCurrent smokerAlcohol use-yesDrug use-noRegular exercise-yes     Past Surgical History  Procedure Date  . Lumbar laminectomy 1975  . Inguinal hernia repair 1993    left  . Colonoscopy multiple    polyps    Family History  Problem Relation Age of Onset  . Colon cancer Neg Hx   . Stomach cancer Neg Hx      Allergies  Allergen Reactions  . Ibuprofen     REACTION: hives, anaphylactic shock ONLY THE RED COATING ON THE PILL!    Current Outpatient Prescriptions on File Prior to Visit  Medication Sig Dispense Refill  . aspirin 325 MG tablet Take 325 mg by mouth daily.        . citalopram (CELEXA) 20 MG tablet Take 1 tablet (20 mg total) by mouth at bedtime.  90 tablet  0  . EPINEPHrine (EPIPEN) 0.3 mg/0.3 mL DEVI Inject 0.3 mg into the muscle once.        . simvastatin (ZOCOR) 20 MG tablet Take 1 tablet (20 mg total) by mouth at bedtime.  90 tablet  0    BP 120/80  Temp 98.8 F (37.1 C) (Oral)  Wt 192 lb (87.091 kg)chart and is in    Objective:   Physical Exam  Constitutional: He is oriented to person, place, and time. He appears well-developed and well-nourished.  HENT:  Right Ear: External ear normal.  Left Ear: External ear normal.  Nose: Nose normal.  Mouth/Throat: Oropharynx is clear and moist.  Neck: Neck supple.       Pain elicited with flexion and extension of the neck. No pain  with rotation. Negative empty can test.  Cardiovascular: Normal rate, regular rhythm and normal heart sounds.   Pulmonary/Chest: Effort normal and breath sounds normal.  Musculoskeletal: Normal range of motion.  Neurological: He is alert and oriented to person, place, and time. He has normal reflexes. He displays normal reflexes. No cranial nerve deficit. He exhibits normal muscle tone. Coordination normal.  Skin: Skin is warm and dry.  Psychiatric: He has a normal mood and affect.          Assessment & Plan:  Assessment: Cervical radiculopathy, left arm pain  Plan: Discussed options to include MRI, physical therapy, and pharmacological therapy. We've elected to go with prednisone 60x3, 40x3, 20x3. Patient will call the office if his symptoms worsen or persist. Recheck with PCP a schedule, appearing.

## 2012-01-31 NOTE — Patient Instructions (Addendum)
Cervical Radiculopathy Cervical radiculopathy happens when a nerve in the neck is pinched or bruised by a slipped (herniated) disk or by arthritic changes in the bones of the cervical spine. This can occur due to an injury or as part of the normal aging process. Pressure on the cervical nerves can cause pain or numbness that runs from your neck all the way down into your arm and fingers. CAUSES  There are many possible causes, including:  Injury.   Muscle tightness in the neck from overuse.   Swollen, painful joints (arthritis).   Breakdown or degeneration in the bones and joints of the spine (spondylosis) due to aging.   Bone spurs that may develop near the cervical nerves.  SYMPTOMS  Symptoms include pain, weakness, or numbness in the affected arm and hand. Pain can be severe or irritating. Symptoms may be worse when extending or turning the neck. DIAGNOSIS  Your caregiver will ask about your symptoms and do a physical exam. He or she may test your strength and reflexes. X-rays, CT scans, and MRI scans may be needed in cases of injury or if the symptoms do not go away after a period of time. Electromyography (EMG) or nerve conduction testing may be done to study how your nerves and muscles are working. TREATMENT  Your caregiver may recommend certain exercises to help relieve your symptoms. Cervical radiculopathy can, and often does, get better with time and treatment. If your problems continue, treatment options may include:  Wearing a soft collar for short periods of time.   Physical therapy to strengthen the neck muscles.   Medicines, such as nonsteroidal anti-inflammatory drugs (NSAIDs), oral corticosteroids, or spinal injections.   Surgery. Different types of surgery may be done depending on the cause of your problems.  HOME CARE INSTRUCTIONS   Put ice on the affected area.   Put ice in a plastic bag.   Place a towel between your skin and the bag.   Leave the ice on for 15  to 20 minutes, 3 to 4 times a day or as directed by your caregiver.   Use a flat pillow when you sleep.   Only take over-the-counter or prescription medicines for pain, discomfort, or fever as directed by your caregiver.   If physical therapy was prescribed, follow your caregiver's directions.   If a soft collar was prescribed, use it as directed.  SEEK IMMEDIATE MEDICAL CARE IF:   Your pain gets much worse and cannot be controlled with medicines.   You have weakness or numbness in your hand, arm, face, or leg.   You have a high fever or a stiff, rigid neck.   You lose bowel or bladder control (incontinence).   You have trouble with walking, balance, or speaking.  MAKE SURE YOU:   Understand these instructions.   Will watch your condition.   Will get help right away if you are not doing well or get worse.  Document Released: 04/12/2001 Document Revised: 07/07/2011 Document Reviewed: 03/01/2011 [employer] Patient Information 2012 [employer]. 

## 2012-02-15 ENCOUNTER — Encounter (HOSPITAL_COMMUNITY): Payer: Self-pay | Admitting: *Deleted

## 2012-02-15 ENCOUNTER — Emergency Department (HOSPITAL_COMMUNITY)
Admission: EM | Admit: 2012-02-15 | Discharge: 2012-02-15 | Disposition: A | Discharge status: Home / Self Care | Payer: BC Managed Care – PPO | Attending: Emergency Medicine | Admitting: Emergency Medicine

## 2012-02-15 DIAGNOSIS — R000 Tachycardia, unspecified: Secondary | ICD-10-CM | POA: Insufficient documentation

## 2012-02-15 DIAGNOSIS — R220 Localized swelling, mass and lump, head: Secondary | ICD-10-CM | POA: Insufficient documentation

## 2012-02-15 DIAGNOSIS — R0989 Other specified symptoms and signs involving the circulatory and respiratory systems: Secondary | ICD-10-CM | POA: Insufficient documentation

## 2012-02-15 DIAGNOSIS — R0609 Other forms of dyspnea: Secondary | ICD-10-CM | POA: Insufficient documentation

## 2012-02-15 DIAGNOSIS — F17200 Nicotine dependence, unspecified, uncomplicated: Secondary | ICD-10-CM | POA: Insufficient documentation

## 2012-02-15 DIAGNOSIS — R61 Generalized hyperhidrosis: Secondary | ICD-10-CM | POA: Insufficient documentation

## 2012-02-15 DIAGNOSIS — E785 Hyperlipidemia, unspecified: Secondary | ICD-10-CM | POA: Insufficient documentation

## 2012-02-15 DIAGNOSIS — T782XXA Anaphylactic shock, unspecified, initial encounter: Secondary | ICD-10-CM

## 2012-02-15 DIAGNOSIS — I959 Hypotension, unspecified: Secondary | ICD-10-CM | POA: Insufficient documentation

## 2012-02-15 MED ORDER — FAMOTIDINE 20 MG PO TABS: 20.0000 mg | ORAL_TABLET | Freq: Two times a day (BID) | ORAL | Status: DC

## 2012-02-15 MED ORDER — DIPHENHYDRAMINE HCL 50 MG/ML IJ SOLN
25.0000 mg | Freq: Once | INTRAMUSCULAR | Status: AC
  Administered 2012-02-15: 25 mg via INTRAVENOUS
  Filled 2012-02-15: qty 1

## 2012-02-15 MED ORDER — EPINEPHRINE 0.3 MG/0.3ML IJ DEVI: 0.3000 mg | INTRAMUSCULAR | Status: AC | PRN

## 2012-02-15 MED ORDER — METHYLPREDNISOLONE SODIUM SUCC 125 MG IJ SOLR
125.0000 mg | Freq: Once | INTRAMUSCULAR | Status: AC
  Administered 2012-02-15: 125 mg via INTRAVENOUS
  Filled 2012-02-15: qty 2

## 2012-02-15 MED ORDER — EPINEPHRINE 0.3 MG/0.3ML IJ DEVI: 0.3000 mg | Freq: Once | INTRAMUSCULAR | Status: DC

## 2012-02-15 MED ORDER — DIPHENHYDRAMINE HCL 25 MG PO CAPS: 25.0000 mg | ORAL_CAPSULE | Freq: Three times a day (TID) | ORAL | Status: DC

## 2012-02-15 MED ORDER — SODIUM CHLORIDE 0.9 % IV BOLUS (SEPSIS)
1000.0000 mL | Freq: Once | INTRAVENOUS | Status: AC
  Administered 2012-02-15: 1000 mL via INTRAVENOUS

## 2012-02-15 MED ORDER — PREDNISONE 10 MG PO TABS: 50.0000 mg | ORAL_TABLET | Freq: Every day | ORAL | Status: AC

## 2012-02-15 MED ORDER — EPINEPHRINE 0.3 MG/0.3ML IJ DEVI
INTRAMUSCULAR | Status: AC
  Administered 2012-02-15: 14:00:00
  Filled 2012-02-15: qty 0.3

## 2012-02-15 MED ORDER — FAMOTIDINE IN NACL 20-0.9 MG/50ML-% IV SOLN
20.0000 mg | Freq: Once | INTRAVENOUS | Status: AC
  Administered 2012-02-15: 20 mg via INTRAVENOUS
  Filled 2012-02-15: qty 50

## 2012-02-15 NOTE — ED Notes (Signed)
Pt given discharge instructions/prescriptions, explained, in no distress upon discharge, taken to discharge window in wheelchair.

## 2012-02-15 NOTE — ED Notes (Signed)
Patient aware of need for urine specimen. Patient unable to void at this time. Patient given urinal. Encouraged to call for assistance if needed.   

## 2012-02-15 NOTE — ED Notes (Addendum)
Blood drawn by RN via IV at bedside

## 2012-02-15 NOTE — ED Notes (Signed)
Pt states he was driving to work and started to have sore throat. Pt stop at [employer] to get something to eat and was unable to swallow. Pt states his lips and face started to swell he  Drove him self to the ed

## 2012-02-15 NOTE — ED Provider Notes (Addendum)
History     CSN: [id number]  Arrival date & time 02/15/12  1334   First MD Initiated Contact with Patient 02/15/12 1346      No chief complaint on file.    HPI The patient presents immediately after his tongue began swelling.  He notes that he recently completed a course of steroids for musculoskeletal pain.  But, he has generally been in his usual state of health.  Today, approximately half hour before arrival the patient took one ibuprofen tablet, and immediately felt swelling in his tongue and difficulty breathing.  He also notes difficulty swallowing.  He denies concurrent significance lightheadedness, confusion, chest pain, dyspnea.  He drove immediately here without any attempts at symptom control. Note: the patient has a history of allergic reactions to some ibuprofen tablets, as well as almonds, but has taken the particular type of ibuprofen he ingested today previously without incident.     Past Medical History  Diagnosis Date  . Hyperlipidemia   . Hematuria     asymptomatic   . Eczema   . Tobacco abuse   . Depression   . Personal history of colonic polyps 01/10/2012    Past Surgical History  Procedure Date  . Lumbar laminectomy 1975  . Inguinal hernia repair 1993    left  . Colonoscopy multiple    polyps    Family History  Problem Relation Age of Onset  . Colon cancer Neg Hx   . Stomach cancer Neg Hx     History  Substance Use Topics  . Smoking status: Current Everyday Smoker -- 1.0 packs/day    Types: Cigarettes  . Smokeless tobacco: Never Used  . Alcohol Use: 1.2 oz/week    2 Cans of beer per week      Review of Systems  Constitutional:       Per HPI, otherwise negative  HENT:       Per HPI, otherwise negative  Eyes: Negative.   Respiratory:       Per HPI, otherwise negative  Cardiovascular:       Per HPI, otherwise negative  Gastrointestinal: Negative for vomiting.  Genitourinary: Negative.   Musculoskeletal:       Per HPI, otherwise  negative  Skin: Negative.   Neurological: Negative for syncope.    Allergies  Ibuprofen  Home Medications   Current Outpatient Rx  Name Route Sig Dispense Refill  . ASPIRIN 325 MG PO TABS Oral Take 325 mg by mouth daily.      Marland Kitchen CITALOPRAM HYDROBROMIDE 20 MG PO TABS Oral Take 1 tablet (20 mg total) by mouth at bedtime. 90 tablet 0  . EPINEPHRINE 0.3 MG/0.3ML IJ DEVI Intramuscular Inject 0.3 mg into the muscle once.      Marland Kitchen SIMVASTATIN 20 MG PO TABS Oral Take 1 tablet (20 mg total) by mouth at bedtime. 90 tablet 0    BP 82/55  Pulse 108  Temp 98.4 F (36.9 C) (Oral)  Resp 26  SpO2 96%  Physical Exam  Nursing note and vitals reviewed. Constitutional: He is oriented to person, place, and time. He appears distressed.       Adult male resting in bed, speaking with mostly clear formation, but with grossly visible tongue.  HENT:  Head: Normocephalic and atraumatic. No trismus in the jaw.  Nose: Nose normal.  Mouth/Throat: Uvula is midline. Mucous membranes are not pale and not dry. No uvula swelling. No oropharyngeal exudate, posterior oropharyngeal edema, posterior oropharyngeal erythema or tonsillar abscesses.  The patient's tongue feels nearly his entire anterior oropharynx, though with wide opening the uvula is briefly visible in the posterior.  Cardiovascular: Tachycardia present.   Pulmonary/Chest: Breath sounds normal. Tachypnea noted.  Abdominal: Soft. Bowel sounds are normal.  Musculoskeletal: He exhibits no edema.  Neurological: He is alert and oriented to person, place, and time. No cranial nerve deficit. He exhibits normal muscle tone. Coordination normal.  Skin: Skin is warm. He is diaphoretic.  Psychiatric: He has a normal mood and affect.    ED Course  Procedures (including critical care time)  Labs Reviewed - No data to display No results found.   No diagnosis found.  Cardiac 110 sinus tach abnormal Pulse ox 95% room air borderline   Date:  02/15/2012  Rate: 109  Rhythm: sinus tachycardia  QRS Axis: normal  Intervals: normal  ST/T Wave abnormalities: nonspecific T wave changes  Conduction Disutrbances:none  Narrative Interpretation:   Old EKG Reviewed: none available ABNORMAL ECG   Immediately after the patient's arrival, with his tongue grossly visible, with his tachycardia, his description of decreased capacity to breathe, he received an EpiPen injection. He subsequently received the rest of his interventions, fluids, steroids, antihistamines   2:54 PM HR 75, Sat's 97%Dixon, BP better (101/53)  1630 Patient breathing without difficulty, speaking clearly, states that he is much better.  We discussed precautions to avoid similar events, the necessity of carrying epipen (and presenting to ED after using it), avoiding possible precipitants, and following up with allergist, as well as PMD.  The patient's wife was present.  Given the near-total resolution of Sx, the patient's desire to go home, and the fact that he will be accompanied (his wife) he will be d/c.  Strict return precautions were provided.  MDM  This 56 year old male presents in distress, diaphoretic, with a large tongue and dyspnea. The patient was also tachycardic, hypotensive, and there was immediate concern for anaphylactic shock.  Given the acuity of his presentation, he was given Epi-Pen empirically, then steroids, anti-histamines.  The patient improved substantially, and was observed in the ED for three hours.  With no recurrence, and a significant improvement in his condition, he was discharged with instructions to f/u w PMD, and abstain from Ibuprofen use, pending allergist approval.  Gerhard Munch, MD 02/15/12 1621  CRITICAL CARE Performed by: Gerhard Munch   Total critical care time: 35  Critical care time was exclusive of separately billable procedures and treating other patients.  Critical care was necessary to treat or prevent imminent or  life-threatening deterioration.  Critical care was time spent personally by me on the following activities: development of treatment plan with patient and/or surrogate as well as nursing, discussions with consultants, evaluation of patient's response to treatment, examination of patient, obtaining history from patient or surrogate, ordering and performing treatments and interventions, ordering and review of laboratory studies, ordering and review of radiographic studies, pulse oximetry and re-evaluation of patient's condition.   Gerhard Munch, MD 02/16/12 [phone]

## 2012-02-15 NOTE — ED Notes (Signed)
Pt states he took 1 gel cap Ibuprofen this morning on an empty stomach, while driving down the road, started itching and tongue/lips swelling, stated usually is allergic to the red coated Ibuprofen but has never had an reaction to the gel caps, pt not in respiratory distress, placed on oxygen, talking w/o difficulty, tongue reddened and swollen, eyes reddened and swollen, lips swollen, itching all over. Epi pen given, states helping with itching.

## 2012-02-23 ENCOUNTER — Other Ambulatory Visit: Payer: Self-pay | Admitting: Family Medicine

## 2012-03-01 ENCOUNTER — Encounter: Payer: BC Managed Care – PPO | Admitting: Family Medicine

## 2012-03-22 ENCOUNTER — Other Ambulatory Visit: Payer: Self-pay | Admitting: Family Medicine

## 2012-04-17 ENCOUNTER — Ambulatory Visit (INDEPENDENT_AMBULATORY_CARE_PROVIDER_SITE_OTHER): Payer: BC Managed Care – PPO | Admitting: Family Medicine

## 2012-04-17 ENCOUNTER — Encounter: Payer: Self-pay | Admitting: Family Medicine

## 2012-04-17 VITALS — BP 130/90 | Temp 97.7°F | Ht 68.5 in | Wt 191.0 lb

## 2012-04-17 DIAGNOSIS — Z86010 Personal history of colonic polyps: Secondary | ICD-10-CM

## 2012-04-17 DIAGNOSIS — Z0000 Encounter for general adult medical examination without abnormal findings: Secondary | ICD-10-CM

## 2012-04-17 DIAGNOSIS — F329 Major depressive disorder, single episode, unspecified: Secondary | ICD-10-CM

## 2012-04-17 DIAGNOSIS — Z888 Allergy status to other drugs, medicaments and biological substances status: Secondary | ICD-10-CM

## 2012-04-17 DIAGNOSIS — R319 Hematuria, unspecified: Secondary | ICD-10-CM

## 2012-04-17 DIAGNOSIS — T7840XA Allergy, unspecified, initial encounter: Secondary | ICD-10-CM | POA: Insufficient documentation

## 2012-04-17 DIAGNOSIS — E785 Hyperlipidemia, unspecified: Secondary | ICD-10-CM

## 2012-04-17 DIAGNOSIS — F17200 Nicotine dependence, unspecified, uncomplicated: Secondary | ICD-10-CM

## 2012-04-17 MED ORDER — CITALOPRAM HYDROBROMIDE 20 MG PO TABS: 20.0000 mg | ORAL_TABLET | Freq: Every day | ORAL | Status: DC

## 2012-04-17 MED ORDER — VARENICLINE TARTRATE 1 MG PO TABS: ORAL_TABLET | ORAL | Status: DC

## 2012-04-17 MED ORDER — SIMVASTATIN 20 MG PO TABS: 20.0000 mg | ORAL_TABLET | Freq: Every day | ORAL | Status: DC

## 2012-04-17 NOTE — Patient Instructions (Signed)
Continue your current medication  Restart the Chantix program it's a 1 mg tablet,,,,,,,,,,, one half tablet Monday Wednesday Friday for a month and then one half tablet daily  Begin a tapering program as outlined  Return in 3-4 weeks for removal of the lesions that we discussed

## 2012-04-17 NOTE — Progress Notes (Signed)
Subjective:    Patient ID: Bryan Lambert, male    DOB: 1955-08-08, 56 y.o.   MRN: [id number]  HPI Bryan Lambert is a 56 year old divorced male,,,,,,,,,,, originally from [place] and he went to [employer],,,,,,,,,, who comes in today for general physical examination  He takes Celexa 20 mg each bedtime for mild depression  He also takes simvastatin 20 mg and an aspirin tablet for hyperlipidemia  This past year he had an allergic reaction to Motrin. He states the color was green and he taken some Motrin couple weeks prior for soreness in his neck and had no reaction this time he took one pill broke out in total body hives his tongue started to swell. He went immediately to emergency room as was given IV steroids and was told not to take Motrin every plan and to keep an EpiPen with him wherever he goes.  He also has a history of urinary frequency however he drinks caffeinated beverages all day long. He had a urologic consult a couple years ago by Dr. Aldean Ast and that was normal. He then saw one of the new urologist and again exam normal  He continues to smoke and his girlfriend also smokes. He has tried the Chantix in the past and I encouraged him to retry that again  He has 2 red moles on his anterior chest wall he was advised to return for removal because her irritated   Review of Systems  Constitutional: Negative.   HENT: Negative.   Eyes: Negative.   Respiratory: Negative.   Cardiovascular: Negative.   Gastrointestinal: Negative.   Genitourinary: Negative.   Musculoskeletal: Negative.   Skin: Negative.   Neurological: Negative.   Hematological: Negative.   Psychiatric/Behavioral: Negative.        Objective:   Physical Exam  Constitutional: He is oriented to person, place, and time. He appears well-developed and well-nourished.  HENT:  Head: Normocephalic and atraumatic.  Right Ear: External ear normal.  Left Ear: External ear normal.  Nose: Nose normal.  Mouth/Throat:  Oropharynx is clear and moist.  Eyes: Conjunctivae normal and EOM are normal. Pupils are equal, round, and reactive to light.  Neck: Normal range of motion. Neck supple. No JVD present. No tracheal deviation present. No thyromegaly present.  Cardiovascular: Normal rate, regular rhythm, normal heart sounds and intact distal pulses.  Exam reveals no gallop and no friction rub.   No murmur heard. Pulmonary/Chest: Effort normal and breath sounds normal. No stridor. No respiratory distress. He has no wheezes. He has no rales. He exhibits no tenderness.  Abdominal: Soft. Bowel sounds are normal. He exhibits no distension and no mass. There is no tenderness. There is no rebound and no guarding.  Genitourinary: Rectum normal, prostate normal and penis normal. Guaiac negative stool. No penile tenderness.  Musculoskeletal: Normal range of motion. He exhibits no edema and no tenderness.  Lymphadenopathy:    He has no cervical adenopathy.  Neurological: He is alert and oriented to person, place, and time. He has normal reflexes. No cranial nerve deficit. He exhibits normal muscle tone.  Skin: Skin is warm and dry. No rash noted. No erythema. No pallor.       Total body skin exam normal except for 2 abnormal lesions right upper anterior chest wall advised to return for removal  Psychiatric: He has a normal mood and affect. His behavior is normal. Judgment and thought content normal.          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

## 2012-05-08 ENCOUNTER — Ambulatory Visit (INDEPENDENT_AMBULATORY_CARE_PROVIDER_SITE_OTHER): Payer: BC Managed Care – PPO | Admitting: Family Medicine

## 2012-05-08 ENCOUNTER — Other Ambulatory Visit: Payer: Self-pay | Admitting: Family Medicine

## 2012-05-08 ENCOUNTER — Encounter: Payer: Self-pay | Admitting: Family Medicine

## 2012-05-08 VITALS — BP 122/72 | HR 72 | Temp 98.0°F | Resp 18 | Ht 68.0 in | Wt 190.0 lb

## 2012-05-08 DIAGNOSIS — D235 Other benign neoplasm of skin of trunk: Secondary | ICD-10-CM

## 2012-05-08 NOTE — Patient Instructions (Signed)
Remove the Band-Aid tomorrow  Within 2 weeks or call you the report

## 2012-05-08 NOTE — Progress Notes (Signed)
  Subjective:    Patient ID: Bryan Lambert, male    DOB: [birth year], 56 y.o.   MRN: [id number]  HPI [name] is a 56 year old single male smoker who comes in today for removal of a lesion on his right upper anterior chest wall just above the clavicle.  The lesion measures 8 mm x8 mm.  After informed consent the lesion was anesthetized with 1% Xylocaine with epinephrine. It was excised with 3 mm margins basis cauterized Band-Aids was applied. The lesion was sent for pathologic analysis. He tolerated the procedure no complications   Review of Systems General and dermatologic review of systems otherwise negative    Objective:   Physical Exam  Procedure see above      Assessment & Plan:  Clinically it appears to be a dysplastic nevus rule out basal cell carcinoma Path pending

## 2013-01-21 ENCOUNTER — Encounter: Payer: Self-pay | Admitting: Internal Medicine

## 2013-04-22 DIAGNOSIS — K8689 Other specified diseases of pancreas: Secondary | ICD-10-CM | POA: Insufficient documentation

## 2013-04-26 ENCOUNTER — Encounter: Payer: Self-pay | Admitting: Internal Medicine

## 2013-04-28 ENCOUNTER — Encounter: Payer: Self-pay | Admitting: Internal Medicine

## 2013-05-01 ENCOUNTER — Other Ambulatory Visit: Payer: Self-pay | Admitting: Family Medicine

## 2013-05-03 ENCOUNTER — Encounter: Payer: Self-pay | Admitting: Internal Medicine

## 2013-06-03 ENCOUNTER — Ambulatory Visit: Payer: BC Managed Care – PPO | Admitting: Internal Medicine

## 2013-07-09 ENCOUNTER — Other Ambulatory Visit: Payer: Self-pay | Admitting: Family Medicine

## 2013-07-15 ENCOUNTER — Encounter: Payer: BC Managed Care – PPO | Admitting: Internal Medicine

## 2013-08-06 ENCOUNTER — Ambulatory Visit: Payer: BC Managed Care – PPO | Admitting: Internal Medicine

## 2013-08-23 ENCOUNTER — Other Ambulatory Visit: Payer: Self-pay | Admitting: Family Medicine

## 2013-08-27 ENCOUNTER — Telehealth: Payer: Self-pay | Admitting: Family Medicine

## 2013-08-27 MED ORDER — CITALOPRAM HYDROBROMIDE 20 MG PO TABS: ORAL_TABLET | ORAL | Status: DC

## 2013-08-27 NOTE — Telephone Encounter (Signed)
Rx sent to pharmacy   

## 2013-08-27 NOTE — Telephone Encounter (Signed)
Pt has made cpe for march 24 w/ labs prior..  Can you refill citalopram (CELEXA) 20 MG tablet until then? [facility] / [address]

## 2013-08-28 ENCOUNTER — Encounter: Payer: Self-pay | Admitting: Internal Medicine

## 2013-10-15 ENCOUNTER — Other Ambulatory Visit (INDEPENDENT_AMBULATORY_CARE_PROVIDER_SITE_OTHER): Payer: BC Managed Care – PPO

## 2013-10-15 DIAGNOSIS — Z0000 Encounter for general adult medical examination without abnormal findings: Secondary | ICD-10-CM

## 2013-10-15 LAB — BASIC METABOLIC PANEL
BUN: 11 mg/dL (ref 6–23)
CALCIUM: 9.1 mg/dL (ref 8.4–10.5)
CO2: 28 mEq/L (ref 19–32)
CREATININE: 0.9 mg/dL (ref 0.4–1.5)
Chloride: 107 mEq/L (ref 96–112)
GFR: 96.45 mL/min (ref >60.00)
Glucose, Bld: 103 mg/dL — ABNORMAL HIGH (ref 70–99)
Potassium: 4.4 mEq/L (ref 3.5–5.1)
SODIUM: 141 meq/L (ref 135–145)

## 2013-10-15 LAB — POCT URINALYSIS DIPSTICK
Bilirubin, UA: NEGATIVE
Glucose, UA: NEGATIVE
KETONES UA: NEGATIVE
LEUKOCYTES UA: NEGATIVE
Nitrite, UA: NEGATIVE
PH UA: 6
Spec Grav, UA: >=1.03
Urobilinogen, UA: 1

## 2013-10-15 LAB — LIPID PANEL
CHOL/HDL RATIO: 3
Cholesterol: 169 mg/dL (ref 0–200)
HDL: 50 mg/dL (ref >39.00)
LDL Cholesterol: 107 mg/dL — ABNORMAL HIGH (ref 0–99)
TRIGLYCERIDES: 61 mg/dL (ref 0.0–149.0)
VLDL: 12.2 mg/dL (ref 0.0–40.0)

## 2013-10-15 LAB — CBC WITH DIFFERENTIAL/PLATELET
BASOS ABS: 0 10*3/uL (ref 0.0–0.1)
BASOS PCT: 0.7 % (ref 0.0–3.0)
Eosinophils Absolute: 0.3 10*3/uL (ref 0.0–0.7)
Eosinophils Relative: 4.5 % (ref 0.0–5.0)
HCT: 45 % (ref 39.0–52.0)
HEMOGLOBIN: 15.2 g/dL (ref 13.0–17.0)
LYMPHS PCT: 25.8 % (ref 12.0–46.0)
Lymphs Abs: 1.5 10*3/uL (ref 0.7–4.0)
MCHC: 33.6 g/dL (ref 30.0–36.0)
MCV: 94 fl (ref 78.0–100.0)
MONOS PCT: 7.6 % (ref 3.0–12.0)
Monocytes Absolute: 0.5 10*3/uL (ref 0.1–1.0)
NEUTROS ABS: 3.7 10*3/uL (ref 1.4–7.7)
NEUTROS PCT: 61.4 % (ref 43.0–77.0)
Platelets: 206 10*3/uL (ref 150.0–400.0)
RBC: 4.79 Mil/uL (ref 4.22–5.81)
RDW: 13.8 % (ref 11.5–14.6)
WBC: 6 10*3/uL (ref 4.5–10.5)

## 2013-10-15 LAB — HEPATIC FUNCTION PANEL
ALT: 18 U/L (ref 0–53)
AST: 21 U/L (ref 0–37)
Albumin: 3.9 g/dL (ref 3.5–5.2)
Alkaline Phosphatase: 57 U/L (ref 39–117)
BILIRUBIN DIRECT: 0.1 mg/dL (ref 0.0–0.3)
BILIRUBIN TOTAL: 0.9 mg/dL (ref 0.3–1.2)
TOTAL PROTEIN: 6.3 g/dL (ref 6.0–8.3)

## 2013-10-15 LAB — TSH: TSH: 0.91 u[IU]/mL (ref 0.35–5.50)

## 2013-10-15 LAB — PSA: PSA: 1.07 ng/mL (ref 0.10–4.00)

## 2013-10-22 ENCOUNTER — Ambulatory Visit (INDEPENDENT_AMBULATORY_CARE_PROVIDER_SITE_OTHER): Payer: BC Managed Care – PPO | Admitting: Family Medicine

## 2013-10-22 ENCOUNTER — Encounter: Payer: Self-pay | Admitting: Family Medicine

## 2013-10-22 VITALS — BP 120/78 | Temp 98.0°F | Ht 67.5 in | Wt 178.0 lb

## 2013-10-22 DIAGNOSIS — R351 Nocturia: Secondary | ICD-10-CM

## 2013-10-22 DIAGNOSIS — E785 Hyperlipidemia, unspecified: Secondary | ICD-10-CM

## 2013-10-22 DIAGNOSIS — F17200 Nicotine dependence, unspecified, uncomplicated: Secondary | ICD-10-CM

## 2013-10-22 DIAGNOSIS — N401 Enlarged prostate with lower urinary tract symptoms: Secondary | ICD-10-CM

## 2013-10-22 DIAGNOSIS — R319 Hematuria, unspecified: Secondary | ICD-10-CM

## 2013-10-22 DIAGNOSIS — F4321 Adjustment disorder with depressed mood: Secondary | ICD-10-CM

## 2013-10-22 MED ORDER — CITALOPRAM HYDROBROMIDE 20 MG PO TABS: ORAL_TABLET | ORAL | Status: DC

## 2013-10-22 MED ORDER — TAMSULOSIN HCL 0.4 MG PO CAPS: 0.4000 mg | ORAL_CAPSULE | Freq: Every day | ORAL | Status: DC

## 2013-10-22 MED ORDER — SIMVASTATIN 20 MG PO TABS: ORAL_TABLET | ORAL | Status: AC

## 2013-10-22 NOTE — Patient Instructions (Signed)
Continue current medications  Chantix 0.5 mg........... one tablet daily in the morning........ taper by 2 per week  Followup in 2 months

## 2013-10-22 NOTE — Progress Notes (Signed)
Pre visit review using our clinic review tool, if applicable. No additional management support is needed unless otherwise documented below in the visit note. 

## 2013-10-22 NOTE — Progress Notes (Signed)
   Subjective:    Patient ID: Bryan Lambert, male    DOB: [birth year], 58 y.o.   MRN: [id number]  HPI  Bryan Lambert is a 58 year old divorced male smoker,,,,,,, one pack a day for many years who is reluctant to try a smoking cessation program,,, who comes in today for general physical examination  He takes Celexa 20 mg daily because of a history of mild depression  He's had a history of urticaria he takes an EpiPen with him when he goes outside  He takes Zocor 20 mg and an aspirin tablet for hyperlipidemia  He takes Flomax 0.4 [facility] for BPH and some outlet symptoms  He does not get routine eye care nor dental care. He's had 2 colonoscopies the last 6 years because of colon polyps.  Vaccinations up-to-date  Review of Systems  Constitutional: Negative.   HENT: Negative.   Eyes: Negative.   Respiratory: Negative.   Cardiovascular: Negative.   Gastrointestinal: Negative.   Genitourinary: Negative.   Musculoskeletal: Negative.   Skin: Negative.   Neurological: Negative.   Psychiatric/Behavioral: Negative.        Objective:   Physical Exam  Nursing note and vitals reviewed. Constitutional: He is oriented to person, place, and time. He appears well-developed and well-nourished.  HENT:  Head: Normocephalic and atraumatic.  Right Ear: External ear normal.  Left Ear: External ear normal.  Nose: Nose normal.  Mouth/Throat: Oropharynx is clear and moist.  Eyes: Conjunctivae and EOM are normal. Pupils are equal, round, and reactive to light.  Neck: Normal range of motion. Neck supple. No JVD present. No tracheal deviation present. No thyromegaly present.  Cardiovascular: Normal rate, regular rhythm, normal heart sounds and intact distal pulses.  Exam reveals no gallop and no friction rub.   No murmur heard. No carotid or bruits peripheral pulses 2+ and symmetrical  Pulmonary/Chest: Effort normal and breath sounds normal. No stridor. No respiratory distress. He has no wheezes. He has  no rales. He exhibits no tenderness.  Abdominal: Soft. Bowel sounds are normal. He exhibits no distension and no mass. There is no tenderness. There is no rebound and no guarding.  Genitourinary: Rectum normal, prostate normal and penis normal. Guaiac negative stool. No penile tenderness.  Small internal hemorrhoid at 6:00 guaiac negative prostate normal  Musculoskeletal: Normal range of motion. He exhibits no edema and no tenderness.  Lymphadenopathy:    He has no cervical adenopathy.  Neurological: He is alert and oriented to person, place, and time. He has normal reflexes. No cranial nerve deficit. He exhibits normal muscle tone.  Skin: Skin is warm and dry. No rash noted. No erythema. No pallor.  Total body skin exam normal  Psychiatric: He has a normal mood and affect. His behavior is normal. Judgment and thought content normal.          Assessment & Plan:  Healthy male  Tobacco abuse,,,,,,,, begin Chantix program followup in 4 weeks  History of mild depression continue Celexa  History of urticaria EpiPen when he goes outside  Hyperlipidemia continue simvastatin and aspirin  BPH Flomax 0.4 daily

## 2013-10-23 ENCOUNTER — Telehealth: Payer: Self-pay | Admitting: Family Medicine

## 2013-10-23 NOTE — Telephone Encounter (Signed)
Relevant patient education mailed to patient.  

## 2013-11-28 ENCOUNTER — Encounter: Payer: Self-pay | Admitting: Family Medicine

## 2013-11-28 ENCOUNTER — Ambulatory Visit (INDEPENDENT_AMBULATORY_CARE_PROVIDER_SITE_OTHER): Payer: BC Managed Care – PPO | Admitting: Family Medicine

## 2013-11-28 VITALS — Wt 175.0 lb

## 2013-11-28 DIAGNOSIS — L723 Sebaceous cyst: Secondary | ICD-10-CM | POA: Insufficient documentation

## 2013-11-28 NOTE — Progress Notes (Signed)
   Subjective:    Patient ID: Bryan Lambert, male    DOB: [birth year], 58 y.o.   MRN: [id number]  HPI Bryan Lambert is a 58 year old male who comes in today for evaluation of 2 issues  He has a lesion on his neck he would like excised  He's tapering the Celexa he's, start the Chantix program ASAP  The lesion on his neck measures 3 mm x 3 mm. After informed consent it was anesthetized with 1% Xylocaine with epinephrine. The sebaceous cyst had walled off. It was excised in toto the base was cauterized Band-Aids was applied   Review of Systems    negative Objective:   Physical Exam Procedure see above       Assessment & Plan:  Sebaceous cyst neck.......Marland Kitchen removed as outlined

## 2013-11-28 NOTE — Patient Instructions (Signed)
Return when necessary 

## 2014-05-15 ENCOUNTER — Encounter: Payer: Self-pay | Admitting: Internal Medicine

## 2014-05-26 ENCOUNTER — Ambulatory Visit (AMBULATORY_SURGERY_CENTER): Payer: Self-pay | Admitting: *Deleted

## 2014-05-26 VITALS — Ht 68.5 in | Wt 173.0 lb

## 2014-05-26 DIAGNOSIS — Z86010 Personal history of colonic polyps: Secondary | ICD-10-CM

## 2014-05-26 NOTE — Progress Notes (Signed)
Patient denies any allergies to eggs or soy. Patient denies any problems with anesthesia/sedation. Patient denies any oxygen use at home and does not take any diet/weight loss medications. EMMI education assisgned to patient on colonoscopy, this was explained and instructions given to patient. 

## 2014-05-30 ENCOUNTER — Encounter: Payer: Self-pay | Admitting: Internal Medicine

## 2014-06-06 ENCOUNTER — Encounter: Payer: BC Managed Care – PPO | Admitting: Internal Medicine

## 2014-06-16 ENCOUNTER — Ambulatory Visit (AMBULATORY_SURGERY_CENTER): Payer: BC Managed Care – PPO | Admitting: Internal Medicine

## 2014-06-16 ENCOUNTER — Encounter: Payer: Self-pay | Admitting: Internal Medicine

## 2014-06-16 ENCOUNTER — Telehealth: Payer: Self-pay

## 2014-06-16 VITALS — BP 101/72 | HR 51 | Temp 96.2°F | Resp 12 | Ht 68.5 in | Wt 173.0 lb

## 2014-06-16 DIAGNOSIS — K8689 Other specified diseases of pancreas: Secondary | ICD-10-CM

## 2014-06-16 DIAGNOSIS — D125 Benign neoplasm of sigmoid colon: Secondary | ICD-10-CM

## 2014-06-16 DIAGNOSIS — Z860100 Personal history of colon polyps, unspecified: Secondary | ICD-10-CM

## 2014-06-16 DIAGNOSIS — K635 Polyp of colon: Secondary | ICD-10-CM

## 2014-06-16 MED ORDER — SODIUM CHLORIDE 0.9 % IV SOLN: 500.0000 mL | INTRAVENOUS | Status: DC

## 2014-06-16 NOTE — Op Note (Signed)
[facility]  [address] [place], [address]   COLONOSCOPY PROCEDURE REPORT  PATIENT: Bryan Lambert, Bryan Lambert  MR#: [id number] BIRTHDATE: Jan 20, 1956 , 80  yrs. old GENDER: male ENDOSCOPIST: Gatha Mayer, MD, [facility] PROCEDURE DATE:  06/16/2014 PROCEDURE:   Colonoscopy with snare polypectomy First Screening Colonoscopy - Avg.  risk and is 50 yrs.  old or older - No.  Prior Negative Screening - Now for repeat screening. N/A  History of Adenoma - Now for follow-up colonoscopy & has been > or = to 3 yrs.  Yes hx of adenoma.  Has been 3 or more years since last colonoscopy.  Polyps Removed Today? Yes. ASA CLASS:   Class II INDICATIONS:surveillance colonoscopy based on a history of adenomatous colonic polyp(s). MEDICATIONS: Propofol 370 mg IV and Monitored anesthesia care  DESCRIPTION OF PROCEDURE:   After the risks benefits and alternatives of the procedure were thoroughly explained, informed consent was obtained.  The digital rectal exam revealed no abnormalities of the rectum, revealed no prostatic nodules, and revealed the prostate was not enlarged.   The [id number] endoscope was introduced through the anus and advanced to the cecum, which was identified by both the appendix and ileocecal valve. No adverse events experienced.   The quality of the prep was excellent, using MiraLax  The instrument was then slowly withdrawn as the colon was fully examined.   COLON FINDINGS: Two sessile polyps ranging from 3 to 66mm in size were found in the sigmoid colon.  Polypectomies were performed with a cold snare.  The resection was complete, the polyp tissue was completely retrieved and sent to histology.   There was diverticulosis noted in the sigmoid colon.   The examination was otherwise normal.  Retroflexed right colon and rectal  views revealed no abnormalities. The time to cecum=3 minutes 42 seconds. Withdrawal time=13 minutes 58 seconds.  The scope was withdrawn  and the procedure completed. COMPLICATIONS: There were no immediate complications.  ENDOSCOPIC IMPRESSION: 1.   Two sessile polyps ranging from 3 to 43mm in size were found in the sigmoid colon; polypectomies were performed with a cold snare 2.   Diverticulosis was noted in the sigmoid colon 3.   The examination was otherwise normal  RECOMMENDATIONS: 1.  Timing of repeat colonoscopy will be determined by pathology findings. 2.  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

## 2014-06-16 NOTE — Telephone Encounter (Signed)
Patient is scheduled for 06/30/14 10:30 at [facility].  Per Lattie Haw at [place] he only needs one bottle of contrast to drink Left message for patient to call back

## 2014-06-16 NOTE — Progress Notes (Signed)
Procedure ends, to recovery, report given and VSS. 

## 2014-06-16 NOTE — Progress Notes (Signed)
Called to room to assist during endoscopic procedure.  Patient ID and intended procedure confirmed with present staff. Received instructions for my participation in the procedure from the performing physician.  

## 2014-06-16 NOTE — Assessment & Plan Note (Signed)
Will recheck CT

## 2014-06-16 NOTE — Telephone Encounter (Signed)
-----   Message from Gatha Mayer, MD sent at 06/16/2014  8:58 AM EST ----- Regarding: CT abd w/ contrast Needs to have CT  abd with contrast to follow-up dilated pancreatic duct on CT 06/2013 [facility])

## 2014-06-16 NOTE — Patient Instructions (Addendum)
Only 2 polyps today! You also have a condition called diverticulosis - common and not usually a problem. Please read the handout provided.  I will let you know pathology results and when to have another routine colonoscopy by mail.  My office will schedule a CT abdomen to follow-up the mildly dilated pancreatic duct seen at CT last year. If you do not hear about this later this week call us.  I appreciate the opportunity to care for you. Gatha Mayer, MD, FACG  YOU HAD AN ENDOSCOPIC PROCEDURE TODAY AT [facility]: Refer to the procedure report that was given to you for any specific questions about what was found during the examination.  If the procedure report does not answer your questions, please call your gastroenterologist to clarify.  If you requested that your care partner not be given the details of your procedure findings, then the procedure report has been included in a sealed envelope for you to review at your convenience later.  YOU SHOULD EXPECT: Some feelings of bloating in the abdomen. Passage of more gas than usual.  Walking can help get rid of the air that was put into your GI tract during the procedure and reduce the bloating. If you had a lower endoscopy (such as a colonoscopy or flexible sigmoidoscopy) you may notice spotting of blood in your stool or on the toilet paper. If you underwent a bowel prep for your procedure, then you may not have a normal bowel movement for a few days.  DIET: Your first meal following the procedure should be a light meal and then it is ok to progress to your normal diet.  A half-sandwich or bowl of soup is an example of a good first meal.  Heavy or fried foods are harder to digest and may make you feel nauseous or bloated.  Likewise meals heavy in dairy and vegetables can cause extra gas to form and this can also increase the bloating.  Drink plenty of fluids but you should avoid alcoholic beverages for 24 hours.  ACTIVITY:  Your care partner should take you home directly after the procedure.  You should plan to take it easy, moving slowly for the rest of the day.  You can resume normal activity the day after the procedure however you should NOT DRIVE or use heavy machinery for 24 hours (because of the sedation medicines used during the test).    SYMPTOMS TO REPORT IMMEDIATELY: A gastroenterologist can be reached at any hour.  During normal business hours, 8:30 AM to 5:00 PM Monday through Friday, call [phone].  After hours and on weekends, please call the GI answering service at [phone] who will take a message and have the physician on call contact you.   Following lower endoscopy (colonoscopy or flexible sigmoidoscopy):  Excessive amounts of blood in the stool  Significant tenderness or worsening of abdominal pains  Swelling of the abdomen that is new, acute  Fever of 100F or higher   FOLLOW UP: If any biopsies were taken you will be contacted by phone or by letter within the next 1-3 weeks.  Call your gastroenterologist if you have not heard about the biopsies in 3 weeks.  Our staff will call the home number listed on your records the next business day following your procedure to check on you and address any questions or concerns that you may have at that time regarding the information given to you following your procedure. This is a Manufacturing engineer  call and so if there is no answer at the home number and we have not heard from you through the emergency physician on call, we will assume that you have returned to your regular daily activities without incident.  SIGNATURES/CONFIDENTIALITY: You and/or your care partner have signed paperwork which will be entered into your electronic medical record.  These signatures attest to the fact that that the information above on your After Visit Summary has been reviewed and is understood.  Full responsibility of the confidentiality of this discharge information lies  with you and/or your care-partner.   Contrast given.  CT to be scheduled for abdomen. Polyp and diverticulosis information given.

## 2014-06-17 ENCOUNTER — Telehealth: Payer: Self-pay | Admitting: *Deleted

## 2014-06-17 NOTE — Telephone Encounter (Signed)
Patient notified of the appt date and time for CT.  All questions answered.  He will call back for any additional questions or concerns

## 2014-06-17 NOTE — Telephone Encounter (Signed)
  Follow up Call-  Call back number 06/16/2014 01/10/2012  Post procedure Call Back phone  # [phone]  Permission to leave phone message Yes Yes     Patient questions:  Do you have a fever, pain , or abdominal swelling? No. Pain Score  0 *  Have you tolerated food without any problems? Yes.    Have you been able to return to your normal activities? Yes.    Do you have any questions about your discharge instructions: Diet   No. Medications  No. Follow up visit  No.  Do you have questions or concerns about your Care? No.  Actions: * If pain score is 4 or above: No action needed, pain <4.  Pt. Received call from [facility] yesterday re:CT scan.  He was unable to get in touch with her to follow-up, but will call her again today.

## 2014-06-20 ENCOUNTER — Encounter: Payer: Self-pay | Admitting: Internal Medicine

## 2014-06-20 ENCOUNTER — Other Ambulatory Visit: Payer: Self-pay | Admitting: Family Medicine

## 2014-06-23 ENCOUNTER — Encounter: Payer: Self-pay | Admitting: Internal Medicine

## 2014-06-30 ENCOUNTER — Ambulatory Visit (INDEPENDENT_AMBULATORY_CARE_PROVIDER_SITE_OTHER)
Admission: RE | Admit: 2014-06-30 | Discharge: 2014-06-30 | Disposition: A | Discharge status: Home / Self Care | Payer: BC Managed Care – PPO | Source: Ambulatory Visit | Attending: Internal Medicine | Admitting: Internal Medicine

## 2014-06-30 DIAGNOSIS — K868 Other specified diseases of pancreas: Secondary | ICD-10-CM

## 2014-06-30 DIAGNOSIS — K8689 Other specified diseases of pancreas: Secondary | ICD-10-CM

## 2014-06-30 MED ORDER — IOHEXOL 300 MG/ML  SOLN
100.0000 mL | Freq: Once | INTRAMUSCULAR | Status: AC | PRN
  Administered 2014-06-30: 100 mL via INTRAVENOUS

## 2014-06-30 NOTE — Progress Notes (Signed)
Quick Note:  1) pancreas ok 2) benign hemangioma in liver 3) benign cyst in liver  No further testing needed ______

## 2014-07-06 ENCOUNTER — Other Ambulatory Visit: Payer: Self-pay | Admitting: Family Medicine

## 2014-08-12 ENCOUNTER — Ambulatory Visit (INDEPENDENT_AMBULATORY_CARE_PROVIDER_SITE_OTHER)
Admission: RE | Admit: 2014-08-12 | Discharge: 2014-08-12 | Disposition: A | Discharge status: Home / Self Care | Payer: 59 | Source: Ambulatory Visit | Attending: Family Medicine | Admitting: Family Medicine

## 2014-08-12 ENCOUNTER — Encounter: Payer: Self-pay | Admitting: Family Medicine

## 2014-08-12 ENCOUNTER — Ambulatory Visit (INDEPENDENT_AMBULATORY_CARE_PROVIDER_SITE_OTHER): Payer: 59 | Admitting: Family Medicine

## 2014-08-12 VITALS — BP 130/80 | Temp 98.1°F | Wt 163.0 lb

## 2014-08-12 DIAGNOSIS — F17200 Nicotine dependence, unspecified, uncomplicated: Secondary | ICD-10-CM

## 2014-08-12 DIAGNOSIS — R634 Abnormal weight loss: Secondary | ICD-10-CM

## 2014-08-12 DIAGNOSIS — F4321 Adjustment disorder with depressed mood: Secondary | ICD-10-CM

## 2014-08-12 DIAGNOSIS — R61 Generalized hyperhidrosis: Secondary | ICD-10-CM | POA: Insufficient documentation

## 2014-08-12 DIAGNOSIS — Z720 Tobacco use: Secondary | ICD-10-CM

## 2014-08-12 LAB — CBC WITH DIFFERENTIAL/PLATELET
BASOS ABS: 0 10*3/uL (ref 0.0–0.1)
Basophils Relative: 0.4 % (ref 0.0–3.0)
EOS ABS: 0.2 10*3/uL (ref 0.0–0.7)
Eosinophils Relative: 1.5 % (ref 0.0–5.0)
HCT: 47.9 % (ref 39.0–52.0)
Hemoglobin: 15.9 g/dL (ref 13.0–17.0)
LYMPHS PCT: 12.8 % (ref 12.0–46.0)
Lymphs Abs: 1.3 10*3/uL (ref 0.7–4.0)
MCHC: 33.3 g/dL (ref 30.0–36.0)
MCV: 96.3 fl (ref 78.0–100.0)
Monocytes Absolute: 0.5 10*3/uL (ref 0.1–1.0)
Monocytes Relative: 5.2 % (ref 3.0–12.0)
Neutro Abs: 8.3 10*3/uL — ABNORMAL HIGH (ref 1.4–7.7)
Neutrophils Relative %: 80.1 % — ABNORMAL HIGH (ref 43.0–77.0)
Platelets: 205 10*3/uL (ref 150.0–400.0)
RBC: 4.97 Mil/uL (ref 4.22–5.81)
RDW: 13.3 % (ref 11.5–15.5)
WBC: 10.3 10*3/uL (ref 4.0–10.5)

## 2014-08-12 LAB — HEPATIC FUNCTION PANEL
ALBUMIN: 4 g/dL (ref 3.5–5.2)
ALK PHOS: 63 U/L (ref 39–117)
ALT: 17 U/L (ref 0–53)
AST: 12 U/L (ref 0–37)
Bilirubin, Direct: 0.2 mg/dL (ref 0.0–0.3)
TOTAL PROTEIN: 6.3 g/dL (ref 6.0–8.3)
Total Bilirubin: 1.3 mg/dL — ABNORMAL HIGH (ref 0.2–1.2)

## 2014-08-12 LAB — POCT URINALYSIS DIPSTICK
Bilirubin, UA: NEGATIVE
Glucose, UA: NEGATIVE
KETONES UA: NEGATIVE
LEUKOCYTES UA: NEGATIVE
Nitrite, UA: NEGATIVE
PH UA: 7
PROTEIN UA: NEGATIVE
Spec Grav, UA: 1.015
UROBILINOGEN UA: 0.2

## 2014-08-12 LAB — BASIC METABOLIC PANEL
BUN: 7 mg/dL (ref 6–23)
CO2: 28 mEq/L (ref 19–32)
Calcium: 9.2 mg/dL (ref 8.4–10.5)
Chloride: 105 mEq/L (ref 96–112)
Creatinine, Ser: 0.9 mg/dL (ref 0.4–1.5)
GFR: 94.9 mL/min (ref >60.00)
Glucose, Bld: 89 mg/dL (ref 70–99)
Potassium: 4.8 mEq/L (ref 3.5–5.1)
SODIUM: 138 meq/L (ref 135–145)

## 2014-08-12 LAB — TSH: TSH: 0.78 u[IU]/mL (ref 0.35–4.50)

## 2014-08-12 LAB — PSA: PSA: 0.82 ng/mL (ref 0.10–4.00)

## 2014-08-12 MED ORDER — TRAZODONE HCL 50 MG PO TABS: ORAL_TABLET | ORAL | Status: DC

## 2014-08-12 MED ORDER — CLONAZEPAM 1 MG PO TABS: 1.0000 mg | ORAL_TABLET | Freq: Two times a day (BID) | ORAL | Status: DC | PRN

## 2014-08-12 NOTE — Progress Notes (Signed)
   Subjective:    Patient ID: Bryan Lambert, male    DOB: 1956/04/20, 59 y.o.   MRN: [id number]  HPI Bryan Lambert is a 59 year old divorce male smoker.......Marland Kitchen 1-2 packs per day........ who comes in today for evaluation of depression insomnia weight loss and night sweats  He was on Celexa 20 mg daily for sleep dysfunction and doing fairly well. In the summer he stopped it for about 2-3 months. He then restart his Celexa about 4 weeks ago because of the sleep dysfunction and depression but it doesn't seem to help. He goes to bed about 10 PM and sleeps for 3040 minutes and wakes up. This goes on all night.  He also states he's lost weight. Current weight 163. Also very concerning symptom of chills and sweats. He says he wakes up about 6:00 in the morning with night sweats in the bed is also can wet.  He 6 Zocor 20 mg daily for hyperlipidemia, Flomax 0.4 daily for BPH,  Weight is down 13 pounds from last spring   Review of Systems Review of systems otherwise negative    Objective:   Physical Exam  Well-developed well-nourished male no acute distress he smells of tobacco  HEENT were negative neck was supple no adenopathy thyroid normal cardiopulmonary exam normal abdominal exam normal      Assessment & Plan:  Depression and insomnia.......... switch to trazodone and Ativan at bedtime  Weight loss and night sweats..........Marland Kitchen begin workup

## 2014-08-12 NOTE — Progress Notes (Signed)
Pre visit review using our clinic review tool, if applicable. No additional management support is needed unless otherwise documented below in the visit note. 

## 2014-08-12 NOTE — Patient Instructions (Signed)
Klonopin 1 mg............ one half tablet twice daily......... if it makes you to sleep even just take a half a tab at bedtime  Trazodone 50 mg........Marland Kitchen 1 at bedtime  Continue the Celexa 20 mg........ one in the morning  Labs today  Go to the main office for your chest x-ray  Return on Thursday for follow-up

## 2014-08-14 ENCOUNTER — Telehealth: Payer: Self-pay | Admitting: Family Medicine

## 2014-08-14 ENCOUNTER — Ambulatory Visit (INDEPENDENT_AMBULATORY_CARE_PROVIDER_SITE_OTHER): Payer: 59 | Admitting: Family Medicine

## 2014-08-14 DIAGNOSIS — R634 Abnormal weight loss: Secondary | ICD-10-CM

## 2014-08-14 DIAGNOSIS — F4321 Adjustment disorder with depressed mood: Secondary | ICD-10-CM

## 2014-08-14 NOTE — Patient Instructions (Signed)
Trazodone 50 mg,,,,,,,,,, 1 at bedtime  Klonopin,,,,,,,,,, one half at bedtime  Avoid caffeine  Taper you nicotine  Call Dr. Letta Moynahan in group [phone] and asked to be seen ASAP

## 2014-08-14 NOTE — Telephone Encounter (Signed)
Pt called to say that Dr Sherren Mocha was going to refer him to behavior health and had mention [facility]. Pt called to say that [facility] is not in network

## 2014-08-14 NOTE — Progress Notes (Signed)
   Subjective:    Patient ID: Bryan Lambert, male    DOB: [birth year], 59 y.o.   MRN: [id number]  HPI Bryan Lambert is a 59 year old divorce male smoker who comes in today for follow-up  We saw him last week with an exacerbation of his depression he also has some physical issues including weight loss. All his diagnostic studies including chest x-ray were normal. We had given him some Desyrel 50 mg at bedtime along with a half a Klonopin at bedtime because of the marked sleep dysfunction. He states he read the labels them because of side effects did not take the medication. He continues to take the Celexa 20 mg daily in the morning  We talked about options including medication, medication changes, and consult with Dr. Caprice Beaver in her group. Bryan Lambert would like a complete evaluation and would like to see somebody Dr. Arvil Persons group which I concur with   Review of Systems    review of systems otherwise negative Objective:   Physical Exam  Well-developed well-nourished male no acute distress vital signs stable he is afebrile      Assessment & Plan:  Depression weight loss.......... continue trazodone 50 mg at bedtime along with Klonopin one half tablet at bedtime as a temporary measure to try to improve his sleep......... he will call Dr. Caprice Beaver in group for consult

## 2014-08-14 NOTE — Telephone Encounter (Signed)
Pt called back and would like a cb today

## 2014-08-14 NOTE — Telephone Encounter (Signed)
Spoke with patient and per Dr Sherren Mocha he should check with his insurance provider to see who is in network.  Patient is aware and will check and bring a copy.

## 2014-08-20 ENCOUNTER — Other Ambulatory Visit: Payer: Self-pay | Admitting: Family Medicine

## 2014-10-05 ENCOUNTER — Encounter (HOSPITAL_COMMUNITY): Payer: Self-pay | Admitting: *Deleted

## 2014-10-05 ENCOUNTER — Emergency Department (HOSPITAL_COMMUNITY)
Admission: EM | Admit: 2014-10-05 | Discharge: 2014-10-05 | Disposition: A | Discharge status: Home / Self Care | Payer: 59 | Attending: Emergency Medicine | Admitting: Emergency Medicine

## 2014-10-05 DIAGNOSIS — E785 Hyperlipidemia, unspecified: Secondary | ICD-10-CM | POA: Insufficient documentation

## 2014-10-05 DIAGNOSIS — Z79899 Other long term (current) drug therapy: Secondary | ICD-10-CM | POA: Diagnosis not present

## 2014-10-05 DIAGNOSIS — Z720 Tobacco use: Secondary | ICD-10-CM | POA: Insufficient documentation

## 2014-10-05 DIAGNOSIS — F32A Depression, unspecified: Secondary | ICD-10-CM

## 2014-10-05 DIAGNOSIS — F329 Major depressive disorder, single episode, unspecified: Secondary | ICD-10-CM | POA: Diagnosis not present

## 2014-10-05 DIAGNOSIS — Z86010 Personal history of colonic polyps: Secondary | ICD-10-CM | POA: Insufficient documentation

## 2014-10-05 DIAGNOSIS — Z7982 Long term (current) use of aspirin: Secondary | ICD-10-CM | POA: Insufficient documentation

## 2014-10-05 DIAGNOSIS — F332 Major depressive disorder, recurrent severe without psychotic features: Secondary | ICD-10-CM | POA: Diagnosis present

## 2014-10-05 DIAGNOSIS — F419 Anxiety disorder, unspecified: Secondary | ICD-10-CM | POA: Insufficient documentation

## 2014-10-05 DIAGNOSIS — Z872 Personal history of diseases of the skin and subcutaneous tissue: Secondary | ICD-10-CM | POA: Insufficient documentation

## 2014-10-05 LAB — RAPID URINE DRUG SCREEN, HOSP PERFORMED
Amphetamines: NOT DETECTED
BENZODIAZEPINES: NOT DETECTED
Barbiturates: NOT DETECTED
COCAINE: NOT DETECTED
OPIATES: NOT DETECTED
Tetrahydrocannabinol: NOT DETECTED

## 2014-10-05 LAB — CBC
HCT: 48.1 % (ref 39.0–52.0)
Hemoglobin: 16.4 g/dL (ref 13.0–17.0)
MCH: 32.8 pg (ref 26.0–34.0)
MCHC: 34.1 g/dL (ref 30.0–36.0)
MCV: 96.2 fL (ref 78.0–100.0)
Platelets: 206 10*3/uL (ref 150–400)
RBC: 5 MIL/uL (ref 4.22–5.81)
RDW: 12.6 % (ref 11.5–15.5)
WBC: 8.5 10*3/uL (ref 4.0–10.5)

## 2014-10-05 LAB — COMPREHENSIVE METABOLIC PANEL
ALBUMIN: 3.9 g/dL (ref 3.5–5.2)
ALT: 15 U/L (ref 0–53)
ANION GAP: 4 — AB (ref 5–15)
AST: 18 U/L (ref 0–37)
Alkaline Phosphatase: 58 U/L (ref 39–117)
BILIRUBIN TOTAL: 0.6 mg/dL (ref 0.3–1.2)
BUN: 13 mg/dL (ref 6–23)
CHLORIDE: 107 mmol/L (ref 96–112)
CO2: 29 mmol/L (ref 19–32)
Calcium: 8.9 mg/dL (ref 8.4–10.5)
Creatinine, Ser: 0.77 mg/dL (ref 0.50–1.35)
Glucose, Bld: 98 mg/dL (ref 70–99)
POTASSIUM: 3.6 mmol/L (ref 3.5–5.1)
SODIUM: 140 mmol/L (ref 135–145)
Total Protein: 6.6 g/dL (ref 6.0–8.3)

## 2014-10-05 LAB — ETHANOL: Alcohol, Ethyl (B): <5 mg/dL (ref 0–9)

## 2014-10-05 LAB — SALICYLATE LEVEL

## 2014-10-05 LAB — ACETAMINOPHEN LEVEL: Acetaminophen (Tylenol), Serum: <10 ug/mL — ABNORMAL LOW (ref 10–30)

## 2014-10-05 MED ORDER — ONDANSETRON HCL 4 MG PO TABS: 4.0000 mg | ORAL_TABLET | Freq: Three times a day (TID) | ORAL | Status: DC | PRN

## 2014-10-05 MED ORDER — LORAZEPAM 1 MG PO TABS: 1.0000 mg | ORAL_TABLET | Freq: Three times a day (TID) | ORAL | Status: DC | PRN

## 2014-10-05 MED ORDER — ACETAMINOPHEN 325 MG PO TABS: 650.0000 mg | ORAL_TABLET | ORAL | Status: DC | PRN

## 2014-10-05 NOTE — Consult Note (Signed)
[facility]   Reason for Consult:  Recurent Major depression Referring Physician:  EDP Patient Identification: Bryan Lambert MRN:  [id number] Principal Diagnosis: Severe recurrent major depression without psychotic features Diagnosis:   Patient Active Problem List   Diagnosis Date Noted  . Severe recurrent major depression without psychotic features [F33.2] 10/05/2014    Priority: High  . Loss of weight [R63.4] 08/12/2014  . Night sweats [R61] 08/12/2014  . Sebaceous cyst [L72.3] 11/28/2013  . BPH associated with nocturia [N40.1, R35.1] 10/22/2013  . Dilated pancreatic duct on CT [K86.8] 04/22/2013  . Dysplastic nevus of trunk [D22.5] 05/08/2012  . Allergic reaction caused by a drug [Z88.9] 04/17/2012  . History of colonic polyps [Z86.010] 01/10/2012  . ESOPHAGITIS, REFLUX [K21.0] 07/31/2007  . HYPERLIPIDEMIA [E78.5] 03/29/2007  . TOBACCO ABUSE [Z72.0] 03/29/2007  . Hematuria [R31.9] 03/29/2007    Total Time spent with patient: 45 minutes  Subjective:   Bryan Lambert is a 59 y.o. male patient admitted with Major depression.  HPI:  Caucasian male, 59 years old was evaluated today for increased feelings of depression.  He has been diagnosed with depression, anxiety and Panic attacks and has been taking his medications as prescribed.  He reports that he has been gradually feeling more depressed and that he OD in January by ingesting unknown amount of Ibuprofen   He reported that nothing happened with the Ibuprofen he took.  Today he woke up and felt more depressed and did not want to go to [place] and he called his GF.  He then told her that he took OD of Ibuprofen in January and that his depression and anxiety is getting intense and she then brought him to the ER.  Patient reports that his stressors are Financial difficulties.  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  He was prescribed medications by  his PMD who later referred him to be seeing a Psychiatrist.  He has seen Dr Owens Shark, Psychiatrist once who prescribed Zoloft.  Patient could not afford Zoloft because it is expensive and his Insurance could not cover the cost.  He is taking Celexa at this time.  He denies SI/HI/AVH, he has an appointment with Dr Owens Shark on the 19 th of this Month.  He has given permission to this Probation officer to speak to his [employer] [facility].  Called Lattie Haw who is very supportive of Patient and encouraged him to seek help today.  She is going to be staying with patient until his next appointment.  She promises to get him back to the hospital if he is not getting better.  Patient is being discharge home to follow up with Dr Owens Shark.  Information for IOP was given and patient and his GF are interested in the program.  Dr Adele Schilder concur to the discharge and assessment.  Patient is discharged home.  HPI Elements:   Location:  Major depression, anxiety disorder, unspecified. Quality:  severe. Severity:  severe. Timing:  Acute. Duration:  For 6 months. Context:  Seeking treatment for depression..  Past Medical History:  Past Medical History  Diagnosis Date  . Hyperlipidemia   . Hematuria     asymptomatic   . Eczema   . Tobacco abuse   . Depression   . Personal history of colonic polyps 01/10/2012    Past Surgical History  Procedure Laterality Date  . Lumbar laminectomy  1975  . Inguinal hernia repair  1993  left  . Colonoscopy  multiple    polyps   Family History:  Family History  Problem Relation Age of Onset  . Colon cancer Neg Hx   . Stomach cancer Neg Hx   . Heart disease Mother   . Kidney disease Mother   . Heart disease Father   . Breast cancer Maternal Aunt    Social History:  History  Alcohol Use No     History  Drug Use No    History   Social History  . Marital Status: Divorced    Spouse Name: N/A  . Number of Children: N/A  . Years of Education: N/A   Social History Main Topics  . Smoking status:  Current Every Day Smoker -- 2.00 packs/day    Types: Cigarettes  . Smokeless tobacco: Never Used  . Alcohol Use: No  . Drug Use: No  . Sexual Activity: Not on file   Other Topics Concern  . Not on file   Social History Narrative   Single   Divorced   Current smoker   Alcohol use-yes   Drug use-no   Regular exercise-yes    Additional Social History:    Pain Medications: none Prescriptions: see MAR Over the Counter: see MAR History of alcohol / drug use?: No history of alcohol / drug abuse Longest period of sobriety (when/how long):  (na) Negative Consequences of Use:  (na) Withdrawal Symptoms:  (na)                     Allergies:   Allergies  Allergen Reactions  . Food Other (See Comments)    ALMONDS  . Ibuprofen     REACTION: hives, anaphylactic shock ONLY THE RED COATING ON THE PILL!  Marland Kitchen Red Dye Swelling    Vitals: Blood pressure 120/56, pulse 67, temperature 98.3 F (36.8 C), temperature source Oral, resp. rate 16, SpO2 98 %.  Risk to Self: Suicidal Ideation: No Suicidal Intent: No Is patient at risk for suicide?: No (has tried to overdose on Ibuprofen and had passive SI recent) Suicidal Plan?: No Access to Means: No What has been your use of drugs/alcohol within the last 12 months?: na - pt denies How many times?: 1 (tried to overdose on Ibuprofen) Other Self Harm Risks: na - pt denies Triggers for Past Attempts: Other (Comment) (Depression) Intentional Self Injurious Behavior: None Risk to Others: Homicidal Ideation: No Thoughts of Harm to Others: No Current Homicidal Intent: No Current Homicidal Plan: No Access to Homicidal Means: No Identified Victim: na - pt denies History of harm to others?: No Assessment of Violence: None Noted Violent Behavior Description: na - pt calm, cooperative Does patient have access to weapons?: No Criminal Charges Pending?: No Does patient have a court date: No Prior Inpatient Therapy: Prior Inpatient Therapy:  No Prior Therapy Dates: na Prior Therapy Facilty/Provider(s): na Reason for Treatment: na Prior Outpatient Therapy: Prior Outpatient Therapy: Yes Prior Therapy Dates: 8 years ago, current Prior Therapy Facilty/Provider(s): divorce counseling with spiritual counselor and currently [place] Reason for Treatment: therapy/med mgnt  Current Facility-Administered Medications  Medication Dose Route Frequency Provider Last Rate Last Dose  . acetaminophen (TYLENOL) tablet 650 mg  650 mg Oral Q4H PRN Ernestina Patches, MD      . LORazepam (ATIVAN) tablet 1 mg  1 mg Oral Q8H PRN Ernestina Patches, MD      . ondansetron (ZOFRAN) tablet 4 mg  4 mg Oral Q8H PRN Ernestina Patches, MD  Current Outpatient Prescriptions  Medication Sig Dispense Refill  . aspirin 325 MG tablet Take 325 mg by mouth daily.      . citalopram (CELEXA) 20 MG tablet take 1 tablet by mouth once daily 90 tablet 1  . clonazePAM (KLONOPIN) 1 MG tablet Take 1 tablet (1 mg total) by mouth 2 (two) times daily as needed for anxiety. 60 tablet 3  . diphenhydrAMINE (SOMINEX) 25 MG tablet Take 25 mg by mouth 3 (three) times daily as needed for itching, allergies or sleep.    Marland Kitchen EPINEPHrine (EPIPEN) 0.3 mg/0.3 mL DEVI Inject 0.3 mLs (0.3 mg total) into the muscle as needed. 1 Device 3  . simvastatin (ZOCOR) 20 MG tablet take 1 tablet by mouth at bedtime 100 tablet 3  . tamsulosin (FLOMAX) 0.4 MG CAPS capsule take 1 capsule by mouth at bedtime 90 capsule 2  . traZODone (DESYREL) 50 MG tablet 1 by mouth daily at bedtime 100 tablet 3  . simvastatin (ZOCOR) 20 MG tablet take 1 tablet by mouth at bedtime (Patient not taking: Reported on 10/05/2014) 100 tablet 3    Musculoskeletal: Strength & Muscle Tone: within normal limits Gait & Station: normal Patient leans: N/A  Psychiatric Specialty Exam:     Blood pressure 120/56, pulse 67, temperature 98.3 F (36.8 C), temperature source Oral, resp. rate 16, SpO2 98 %.There is no weight on file to  calculate BMI.  General Appearance: Casual and Well Groomed  Eye Contact::  Good  Speech:  Clear and Coherent and Normal Rate  Volume:  Normal  Mood:  Anxious and Depressed  Affect:  Congruent and Depressed  Thought Process:  Coherent, Goal Directed and Intact  Orientation:  Full (Time, Place, and Person)  Thought Content:  WDL  Suicidal Thoughts:  No  Homicidal Thoughts:  No  Memory:  Immediate;   Good Recent;   Good Remote;   Good  Judgement:  Good  Insight:  Good  Psychomotor Activity:  Normal  Concentration:  Good  Recall:  NA  Fund of Knowledge:Good  Language: Good  Akathisia:  NA  Handed:  Right  AIMS (if indicated):     Assets:  Desire for Improvement  ADL's:  Intact  Cognition: WNL  Sleep:      Medical Decision Making: Established Problem, Stable/Improving (1) and Review of Medication Regimen & Side Effects (2)  Treatment Plan Summary: Plan Discharge home  Plan:  Refer to IOP. Discussed crisis plan, support from social network, calling 911, coming to the Emergency Department, and calling Suicide Hotline. See below, discharged home Disposition: Discharge home  [name], Loletha Grayer   PMHNP-BC 10/05/2014 5:35 PM   I agreed with the findings, treatment and disposition plan of this patient. Berniece Andreas, MD

## 2014-10-05 NOTE — ED Notes (Signed)
Bryan Lambert [phone], pts gf  Pt gf is taking all of belongings. Pt has glasses with him only.

## 2014-10-05 NOTE — ED Provider Notes (Signed)
CSN: [id number]     Arrival date & time 10/05/14  1407 History   First MD Initiated Contact with Patient 10/05/14 1522     Chief Complaint  Patient presents with  . depression      (Consider location/radiation/quality/duration/timing/severity/associated sxs/prior Treatment) Patient is a 59 y.o. male presenting with mental health disorder. The history is provided by the patient.  Mental Health Problem Presenting symptoms: suicidal thoughts and suicide attempt   Presenting symptoms: no agitation, no hallucinations and no homicidal ideas   Presenting symptoms comment:  Depression, anxiety Patient accompanied by:  Partner Degree of incapacity (severity):  Severe Onset quality:  Sudden Duration:  6 months Timing:  Constant Progression:  Worsening Chronicity:  New Context: stressful life event   Treatment compliance:  All of the time Relieved by:  Nothing Worsened by:  Nothing tried Ineffective treatments:  None tried Associated symptoms: anhedonia, hypersomnia and weight change   Associated symptoms: no abdominal pain, no appetite change, no chest pain, no fatigue and no headaches   Risk factors: hx of mental illness and hx of suicide attempts     Past Medical History  Diagnosis Date  . Hyperlipidemia   . Hematuria     asymptomatic   . Eczema   . Tobacco abuse   . Depression   . Personal history of colonic polyps 01/10/2012   Past Surgical History  Procedure Laterality Date  . Lumbar laminectomy  1975  . Inguinal hernia repair  1993    left  . Colonoscopy  multiple    polyps   Family History  Problem Relation Age of Onset  . Colon cancer Neg Hx   . Stomach cancer Neg Hx   . Heart disease Mother   . Kidney disease Mother   . Heart disease Father   . Breast cancer Maternal Aunt    History  Substance Use Topics  . Smoking status: Current Every Day Smoker -- 2.00 packs/day    Types: Cigarettes  . Smokeless tobacco: Never Used  . Alcohol Use: No    Review of  Systems  Constitutional: Negative for fever, activity change, appetite change and fatigue.  HENT: Negative for congestion, facial swelling, rhinorrhea and trouble swallowing.   Eyes: Negative for photophobia and pain.  Respiratory: Negative for cough, chest tightness and shortness of breath.   Cardiovascular: Negative for chest pain and leg swelling.  Gastrointestinal: Negative for nausea, vomiting, abdominal pain, diarrhea and constipation.  Endocrine: Negative for polydipsia and polyuria.  Genitourinary: Negative for dysuria, urgency, decreased urine volume and difficulty urinating.  Musculoskeletal: Negative for back pain and gait problem.  Skin: Negative for color change, rash and wound.  Allergic/Immunologic: Negative for immunocompromised state.  Neurological: Negative for dizziness, facial asymmetry, speech difficulty, weakness, numbness and headaches.  Psychiatric/Behavioral: Positive for suicidal ideas. Negative for homicidal ideas, hallucinations, confusion, decreased concentration and agitation.      Allergies  Food; Ibuprofen; and Red dye  Home Medications   Prior to Admission medications   Medication Sig Start Date End Date Taking? Authorizing Provider  aspirin 325 MG tablet Take 325 mg by mouth daily.     Yes Historical Provider, MD  citalopram (CELEXA) 20 MG tablet take 1 tablet by mouth once daily 07/06/14  Yes Dorena Cookey, MD  clonazePAM (KLONOPIN) 1 MG tablet Take 1 tablet (1 mg total) by mouth 2 (two) times daily as needed for anxiety. 08/12/14  Yes Dorena Cookey, MD  diphenhydrAMINE (SOMINEX) 25 MG tablet Take 25 mg  by mouth 3 (three) times daily as needed for itching, allergies or sleep.   Yes Historical Provider, MD  EPINEPHrine (EPIPEN) 0.3 mg/0.3 mL DEVI Inject 0.3 mLs (0.3 mg total) into the muscle as needed. 02/15/12  Yes Carmin Muskrat, MD  simvastatin (ZOCOR) 20 MG tablet take 1 tablet by mouth at bedtime 10/22/13  Yes Dorena Cookey, MD  tamsulosin  [facility]) 0.4 MG CAPS capsule take 1 capsule by mouth at bedtime 06/23/14  Yes Dorena Cookey, MD  traZODone (DESYREL) 50 MG tablet 1 by mouth daily at bedtime 08/12/14  Yes Dorena Cookey, MD   BP 121/73 mmHg  Pulse 60  Temp(Src) 98.7 F (37.1 C) (Oral)  Resp 18  SpO2 98% Physical Exam  Constitutional: He is oriented to person, place, and time. He appears well-developed and well-nourished. No distress.  HENT:  Head: Normocephalic and atraumatic.  Mouth/Throat: No oropharyngeal exudate.  Eyes: Pupils are equal, round, and reactive to light.  Neck: Normal range of motion. Neck supple.  Cardiovascular: Normal rate, regular rhythm and normal heart sounds.  Exam reveals no gallop and no friction rub.   No murmur heard. Pulmonary/Chest: Effort normal and breath sounds normal. No respiratory distress. He has no wheezes. He has no rales.  Abdominal: Soft. Bowel sounds are normal. He exhibits no distension and no mass. There is no tenderness. There is no rebound and no guarding.  Musculoskeletal: Normal range of motion. He exhibits no edema or tenderness.  Neurological: He is alert and oriented to person, place, and time.  Skin: Skin is warm and dry.  Psychiatric: His speech is delayed. Thought content is not paranoid. He exhibits a depressed mood. He expresses suicidal ideation. He expresses no homicidal ideation.    ED Course  Procedures (including critical care time) Labs Review Labs Reviewed  ACETAMINOPHEN LEVEL - Abnormal; Notable for the following:    Acetaminophen (Tylenol), Serum <10.0 (*)    All other components within normal limits  COMPREHENSIVE METABOLIC PANEL - Abnormal; Notable for the following:    Anion gap 4 (*)    All other components within normal limits  CBC  ETHANOL  SALICYLATE LEVEL  URINE RAPID DRUG SCREEN (HOSP PERFORMED)    Imaging Review No results found.   EKG Interpretation None      MDM   Final diagnoses:  Depression  Anxiety    Pt is a 59  y.o. male with Pmhx as above who presents with months of worsening depression, suicide attempt in Jan by OD on ibuprofen (allergic to), with worsening anxiety, and thoughts of suicide w/o clear plan. Pt has significant risk factors for completed suicide including age, sex, recent change in financial situation, ownership of firearm, hx of suicide attempt. Pt medically clear, will consult TTS.        Ernestina Patches, MD 10/07/14 [phone]

## 2014-10-05 NOTE — BH Assessment (Addendum)
Tele Assessment Note   Bryan Lambert is an 59 y.o. male that was seen this day via tele assessment.  Pt presents to [facility] due to having a panic attack this AM and reporting he needs help.  He called his girlfriend and she brought him to ED.  Pt reports he has been depressed for months, with worsening depression over last 2 mos.  He tried to overdose on Ibuprofen in January and has had passive SI over last 2 mos.  Pt currently denies SI.  Pt does endorse sx of depression and anxiety.  He reports since starting antianxiety medication and sleep medication prescribed by his PCP, [name], (Citalopram, Clonazepam, and Trazadone) that the panic attacks have lessened, but that he had one this AM.  Pt reports a weight loss of 30 lbs, depressed mood, anxiety, fatigue, sadness, loss of interest in usual activities.  However, pt denies current SI.  Pt stated he went to [facility] to receive a psych eval and that his next appt is 10/18/14 with [name] at [facility].  Pt denies HI or AVH.  No delusions noted.  He does have ownership of a firearm per EDP note (pt denied this during assessment with this clinician).  Pt denies SA.  His only previous mental health treatment was divorce counseling 8 years ago.  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  Consulted with [name], NP who stated she will go in and see the pt at [facility].  Consulted with [name], NP at [phone], who stated pt could be discharged with girlfriend and follow up with appt at [facility] on 3/19.  Updated EDP Docherty, who was in agreement with disposition at 1732.  Pt to be discharged and follow up with provider.  Axis I: 296.33 Major Depressive Disorder, Recurrent Episode, Severe Axis II: Deferred Axis III:  Past Medical History  Diagnosis Date  . Hyperlipidemia   . Hematuria     asymptomatic   . Eczema   . Tobacco abuse    . Depression   . Personal history of colonic polyps 01/10/2012   Axis IV: economic problems, occupational problems and problems with primary support group Axis V: 41-50 serious symptoms  Past Medical History:  Past Medical History  Diagnosis Date  . Hyperlipidemia   . Hematuria     asymptomatic   . Eczema   . Tobacco abuse   . Depression   . Personal history of colonic polyps 01/10/2012    Past Surgical History  Procedure Laterality Date  . Lumbar laminectomy  1975  . Inguinal hernia repair  1993    left  . Colonoscopy  multiple    polyps    Family History:  Family History  Problem Relation Age of Onset  . Colon cancer Neg Hx   . Stomach cancer Neg Hx   . Heart disease Mother   . Kidney disease Mother   . Heart disease Father   . Breast cancer Maternal Aunt     Social History:  reports that he has been smoking Cigarettes.  He has been smoking about 2.00 packs per day. He has never used smokeless tobacco. He reports that he does not drink alcohol or use illicit drugs.  Additional Social History:  Alcohol / Drug Use Pain Medications: none Prescriptions: see MAR Over the Counter: see MAR History of alcohol / drug use?: No history of alcohol / drug  abuse Longest period of sobriety (when/how long):  (na) Negative Consequences of Use:  (na) Withdrawal Symptoms:  (na)  CIWA: CIWA-Ar BP: 120/56 mmHg Pulse Rate: 67 COWS:    PATIENT STRENGTHS: (choose at least two) Ability for insight Average or above average intelligence Capable of independent living Communication skills General fund of knowledge Motivation for treatment/growth Supportive family/friends Work skills  Allergies:  Allergies  Allergen Reactions  . Food Other (See Comments)    ALMONDS  . Ibuprofen     REACTION: hives, anaphylactic shock ONLY THE RED COATING ON THE PILL!  Marland Kitchen Red Dye Swelling    Home Medications:  (Not in a hospital admission)  OB/GYN Status:  No LMP for male  patient.  General Assessment Data Location of Assessment: [facility] ED Is this a Tele or Face-to-Face Assessment?: Tele Assessment Is this an Initial Assessment or a Re-assessment for this encounter?: Initial Assessment Living Arrangements: Alone Can pt return to current living arrangement?: Yes Admission Status: Voluntary Is patient capable of signing voluntary admission?: Yes Transfer from: Home Referral Source: Self/Family/Friend     [place] Living Arrangements: Alone Name of Psychiatrist: [facility] Name of Therapist: none  Education Status Is patient currently in school?: No  Risk to self with the past 6 months Suicidal Ideation: No Suicidal Intent: No Is patient at risk for suicide?: No (has tried to overdose on Ibuprofen and had passive SI recent) Suicidal Plan?: No Access to Means: No What has been your use of drugs/alcohol within the last 12 months?: na - pt denies Previous Attempts/Gestures: Yes How many times?: 1 (tried to overdose on Ibuprofen) Other Self Harm Risks: na - pt denies Triggers for Past Attempts: Other (Comment) (Depression) Intentional Self Injurious Behavior: None Family Suicide History: No Recent stressful life event(s): Job Loss, Financial Problems, Other (Comment) (Depression) Persecutory voices/beliefs?: No Depression: Yes Depression Symptoms: Despondent, Insomnia, Isolating, Fatigue, Guilt, Loss of interest in usual pleasures, Feeling worthless/self pity Substance abuse history and/or treatment for substance abuse?: No Suicide prevention information given to non-admitted patients: Yes  Risk to Others within the past 6 months Homicidal Ideation: No Thoughts of Harm to Others: No Current Homicidal Intent: No Current Homicidal Plan: No Access to Homicidal Means: No Identified Victim: na - pt denies History of harm to others?: No Assessment of Violence: None Noted Violent Behavior Description: na - pt calm, cooperative Does  patient have access to weapons?: No Criminal Charges Pending?: No Does patient have a court date: No  Psychosis Hallucinations: None noted Delusions: None noted  Mental Status Report Appear/Hygiene: Disheveled Eye Contact: Good Motor Activity: Freedom of movement Speech: Logical/coherent Level of Consciousness: Alert Mood: Depressed Affect: Appropriate to circumstance Anxiety Level: Panic Attacks Panic attack frequency: reports they have lessened Most recent panic attack: today Thought Processes: Coherent, Relevant Judgement: Unimpaired Orientation: Person, Place, Time, Situation Obsessive Compulsive Thoughts/Behaviors: None  Cognitive Functioning Concentration: Normal Memory: Recent Intact, Remote Intact IQ: Average Insight: Fair Impulse Control: Fair Appetite: Poor Weight Loss: 30 Weight Gain: 0 Sleep: Decreased Total Hours of Sleep:  (varies - pt reports can sleep when takes meds) Vegetative Symptoms: None  ADLScreening [facility] Assessment Services) Patient's cognitive ability adequate to safely complete daily activities?: Yes Patient able to express need for assistance with ADLs?: Yes Independently performs ADLs?: Yes (appropriate for developmental age)  Prior Inpatient Therapy Prior Inpatient Therapy: No Prior Therapy Dates: na Prior Therapy Facilty/Provider(s): na Reason for Treatment: na  Prior Outpatient Therapy Prior Outpatient Therapy: Yes Prior Therapy Dates:  8 years ago, current Prior Therapy Facilty/Provider(s): divorce counseling with spiritual counselor and currently [place] Reason for Treatment: therapy/med mgnt  ADL Screening (condition at time of admission) Patient's cognitive ability adequate to safely complete daily activities?: Yes Is the patient deaf or have difficulty hearing?: No Does the patient have difficulty seeing, even when wearing glasses/contacts?: No Does the patient have difficulty concentrating, remembering, or making  decisions?: No Patient able to express need for assistance with ADLs?: Yes Does the patient have difficulty dressing or bathing?: No Independently performs ADLs?: Yes (appropriate for developmental age) Does the patient have difficulty walking or climbing stairs?: No  Home Assistive Devices/Equipment Home Assistive Devices/Equipment: None    Abuse/Neglect Assessment (Assessment to be complete while patient is alone) Physical Abuse: Denies Verbal Abuse: Denies Sexual Abuse: Denies Exploitation of patient/patient's resources: Denies Self-Neglect: Denies Values / Beliefs Cultural Requests During Hospitalization: None Spiritual Requests During Hospitalization: None Consults Spiritual Care Consult Needed: No Social Work Consult Needed: No Regulatory affairs officer (For Healthcare) Does patient have an advance directive?: Yes Type of Advance Directive: Healthcare Power of Attorney, Living will Does patient want to make changes to advanced directive?: No - Patient declined    Additional Information 1:1 In Past 12 Months?: No CIRT Risk: No Elopement Risk: No Does patient have medical clearance?: Yes     Disposition:  Disposition Initial Assessment Completed for this Encounter: Yes Disposition of Patient: Other dispositions Other disposition(s): Other (Comment) (pending disposition)  Shaune Pascal, MS, [facility] Therapeutic Triage Specialist [facility]   10/05/2014 5:04 PM

## 2014-10-05 NOTE — BH Assessment (Signed)
[place] Assessment Progress Note  Called and scheduled pt's tele assessment with this clinician.  Gathered clinical information from [facility] at 1620.  Tele assessment to be completed.  Shaune Pascal, MS, [facility] Licensed Professional Counselor Therapeutic Triage Specialist [facility] Phone: [phone] Fax: [phone]

## 2014-10-05 NOTE — BHH Suicide Risk Assessment (Cosign Needed)
Suicide Risk Assessment  Discharge Assessment   [facility] Discharge Suicide Risk Assessment   Demographic Factors:  Male and Caucasian  Total Time spent with patient: 20 minutes  Musculoskeletal: Strength & Muscle Tone: within normal limits Gait & Station: normal Patient leans: N/A  Psychiatric Specialty Exam:     Blood pressure 121/73, pulse 60, temperature 98.7 F (37.1 C), temperature source Oral, resp. rate 18, SpO2 98 %.There is no weight on file to calculate BMI.  General Appearance: Casual and Well Groomed  Eye Contact::  Good  Speech:  Clear and Coherent and Normal Rate409  Volume:  Normal  Mood:  Anxious and Depressed  Affect:  Congruent and Depressed  Thought Process:  Coherent, Goal Directed and Intact  Orientation:  Full (Time, Place, and Person)  Thought Content:  WDL  Suicidal Thoughts:  No  Homicidal Thoughts:  No  Memory:  Immediate;   Good Recent;   Good Remote;   Good  Judgement:  Good  Insight:  Good  Psychomotor Activity:  Normal  Concentration:  Good  Recall:  NA  Fund of Knowledge:Good  Language: Good  Akathisia:  NA  Handed:  Right  AIMS (if indicated):     Assets:  Desire for Improvement  Sleep:     Cognition: WNL  ADL's:  Intact      Has this patient used any form of tobacco in the last 30 days? (Cigarettes, Smokeless Tobacco, Cigars, and/or Pipes) Yes, A prescription for an FDA-approved tobacco cessation medication was offered at discharge and the patient refused  Mental Status Per Nursing Assessment::   On Admission:     Current Mental Status by Physician: NA  Loss Factors: Financial problems/change in socioeconomic status  Historical Factors: Prior suicide attempts  Risk Reduction Factors:   Living with another person, especially a relative and Positive therapeutic relationship  Continued Clinical Symptoms:  Depression:   Insomnia  Cognitive Features That Contribute To Risk:  Polarized thinking    Suicide Risk:  Minimal:  No identifiable suicidal ideation.  Patients presenting with no risk factors but with morbid ruminations; may be classified as minimal risk based on the severity of the depressive symptoms  Principal Problem: Severe recurrent major depression without psychotic features Discharge Diagnoses:  Patient Active Problem List   Diagnosis Date Noted  . Severe recurrent major depression without psychotic features [F33.2] 10/05/2014    Priority: High  . Loss of weight [R63.4] 08/12/2014  . Night sweats [R61] 08/12/2014  . Sebaceous cyst [L72.3] 11/28/2013  . BPH associated with nocturia [N40.1, R35.1] 10/22/2013  . Dilated pancreatic duct on CT [K86.8] 04/22/2013  . Dysplastic nevus of trunk [D22.5] 05/08/2012  . Allergic reaction caused by a drug [Z88.9] 04/17/2012  . History of colonic polyps [Z86.010] 01/10/2012  . ESOPHAGITIS, REFLUX [K21.0] 07/31/2007  . HYPERLIPIDEMIA [E78.5] 03/29/2007  . TOBACCO ABUSE [Z72.0] 03/29/2007  . Hematuria [R31.9] 03/29/2007      Plan Of Care/Follow-up recommendations:  Activity:  As tolerated Diet:  Regular  Is patient on multiple antipsychotic therapies at discharge:  No   Has Patient had three or more failed trials of antipsychotic monotherapy by history:  No  Recommended Plan for Multiple Antipsychotic Therapies: NA    Hendrick Pavich, C     PMHNP-BC 10/05/2014, 5:59 PM

## 2014-10-05 NOTE — ED Notes (Addendum)
Pt reports depression since September due to financial stress, went to his pcp in Jan Dr Sherren Mocha, was prescribed clonazepam, citalopram, and trazodone, and was referred  to Dr Owens Shark at [facility]. Dr Owens Shark in early February saw pt, did psych eval and was told to come back in a month to re-evaluate. Pt is currently having severe anxiety/ panic attacks. Pt was told by Dr Sherren Mocha to only take 1/2 tablet of klonozapam at night.   Pt had SI attempt in late January, after he saw Dr Sherren Mocha, but did not tell Dr Owens Shark psychiatrist. Pt has hx of anaphylaxis with ibuprofen. Pt attempted SI by taking ibuprofen.  Pt denies want to kill himself, but reports he has SI thoughts at times. Contracts for safety.  Denies HI, AH/VH. Denies ETOH or drug use.

## 2015-04-15 ENCOUNTER — Other Ambulatory Visit: Payer: Self-pay | Admitting: Family Medicine

## 2015-05-14 ENCOUNTER — Telehealth: Payer: Self-pay | Admitting: Family Medicine

## 2015-05-14 NOTE — Telephone Encounter (Signed)
Pt is asking for a physical this year can he be worked in

## 2015-05-18 NOTE — Telephone Encounter (Signed)
No work-ins per Dr Sherren Mocha.  I can refill his medication until his appointment.

## 2015-05-18 NOTE — Telephone Encounter (Signed)
lmovm to return call  °

## 2015-05-27 NOTE — Telephone Encounter (Signed)
lmovm to call and schedule an appt  °

## 2015-09-13 ENCOUNTER — Other Ambulatory Visit: Payer: Self-pay | Admitting: Family Medicine

## 2015-10-18 ENCOUNTER — Inpatient Hospital Stay (HOSPITAL_COMMUNITY)
Admission: EM | Admit: 2015-10-18 | Discharge: 2015-10-19 | DRG: 175 | Disposition: A | Discharge status: Home / Self Care | Payer: BLUE CROSS/BLUE SHIELD | Attending: Internal Medicine | Admitting: Internal Medicine

## 2015-10-18 ENCOUNTER — Encounter (HOSPITAL_COMMUNITY): Payer: Self-pay | Admitting: Emergency Medicine

## 2015-10-18 ENCOUNTER — Emergency Department (HOSPITAL_COMMUNITY): Payer: BLUE CROSS/BLUE SHIELD

## 2015-10-18 DIAGNOSIS — N401 Enlarged prostate with lower urinary tract symptoms: Secondary | ICD-10-CM | POA: Diagnosis present

## 2015-10-18 DIAGNOSIS — Z860100 Personal history of colon polyps, unspecified: Secondary | ICD-10-CM

## 2015-10-18 DIAGNOSIS — I2699 Other pulmonary embolism without acute cor pulmonale: Principal | ICD-10-CM | POA: Diagnosis present

## 2015-10-18 DIAGNOSIS — Z7982 Long term (current) use of aspirin: Secondary | ICD-10-CM | POA: Diagnosis not present

## 2015-10-18 DIAGNOSIS — F17210 Nicotine dependence, cigarettes, uncomplicated: Secondary | ICD-10-CM | POA: Diagnosis present

## 2015-10-18 DIAGNOSIS — Z803 Family history of malignant neoplasm of breast: Secondary | ICD-10-CM

## 2015-10-18 DIAGNOSIS — R351 Nocturia: Secondary | ICD-10-CM | POA: Diagnosis present

## 2015-10-18 DIAGNOSIS — N400 Enlarged prostate without lower urinary tract symptoms: Secondary | ICD-10-CM | POA: Diagnosis present

## 2015-10-18 DIAGNOSIS — F17200 Nicotine dependence, unspecified, uncomplicated: Secondary | ICD-10-CM | POA: Diagnosis present

## 2015-10-18 DIAGNOSIS — Z915 Personal history of self-harm: Secondary | ICD-10-CM | POA: Diagnosis not present

## 2015-10-18 DIAGNOSIS — Z886 Allergy status to analgesic agent status: Secondary | ICD-10-CM | POA: Diagnosis not present

## 2015-10-18 DIAGNOSIS — F419 Anxiety disorder, unspecified: Secondary | ICD-10-CM | POA: Diagnosis present

## 2015-10-18 DIAGNOSIS — Z8249 Family history of ischemic heart disease and other diseases of the circulatory system: Secondary | ICD-10-CM

## 2015-10-18 DIAGNOSIS — Z91018 Allergy to other foods: Secondary | ICD-10-CM

## 2015-10-18 DIAGNOSIS — Z9102 Food additives allergy status: Secondary | ICD-10-CM | POA: Diagnosis not present

## 2015-10-18 DIAGNOSIS — Z79899 Other long term (current) drug therapy: Secondary | ICD-10-CM | POA: Diagnosis not present

## 2015-10-18 DIAGNOSIS — Z841 Family history of disorders of kidney and ureter: Secondary | ICD-10-CM

## 2015-10-18 DIAGNOSIS — F332 Major depressive disorder, recurrent severe without psychotic features: Secondary | ICD-10-CM | POA: Diagnosis present

## 2015-10-18 DIAGNOSIS — R0602 Shortness of breath: Secondary | ICD-10-CM | POA: Diagnosis not present

## 2015-10-18 DIAGNOSIS — E785 Hyperlipidemia, unspecified: Secondary | ICD-10-CM | POA: Diagnosis present

## 2015-10-18 DIAGNOSIS — J189 Pneumonia, unspecified organism: Secondary | ICD-10-CM | POA: Diagnosis present

## 2015-10-18 LAB — BASIC METABOLIC PANEL
Anion gap: 6 (ref 5–15)
BUN: 13 mg/dL (ref 6–20)
CHLORIDE: 106 mmol/L (ref 101–111)
CO2: 27 mmol/L (ref 22–32)
Calcium: 8.7 mg/dL — ABNORMAL LOW (ref 8.9–10.3)
Creatinine, Ser: 0.79 mg/dL (ref 0.61–1.24)
GFR calc Af Amer: >60 mL/min (ref >60)
GFR calc non Af Amer: >60 mL/min (ref >60)
GLUCOSE: 99 mg/dL (ref 65–99)
POTASSIUM: 4 mmol/L (ref 3.5–5.1)
Sodium: 139 mmol/L (ref 135–145)

## 2015-10-18 LAB — CBC
HEMATOCRIT: 43.6 % (ref 39.0–52.0)
Hemoglobin: 15.1 g/dL (ref 13.0–17.0)
MCH: 32.1 pg (ref 26.0–34.0)
MCHC: 34.6 g/dL (ref 30.0–36.0)
MCV: 92.6 fL (ref 78.0–100.0)
Platelets: 207 10*3/uL (ref 150–400)
RBC: 4.71 MIL/uL (ref 4.22–5.81)
RDW: 12.6 % (ref 11.5–15.5)
WBC: 12.4 10*3/uL — ABNORMAL HIGH (ref 4.0–10.5)

## 2015-10-18 LAB — I-STAT TROPONIN, ED: TROPONIN I, POC: 0 ng/mL (ref 0.00–0.08)

## 2015-10-18 LAB — APTT: aPTT: 29 seconds (ref 24–37)

## 2015-10-18 LAB — PROTIME-INR
INR: 1.05 (ref 0.00–1.49)
PROTHROMBIN TIME: 13.9 s (ref 11.6–15.2)

## 2015-10-18 LAB — D-DIMER, QUANTITATIVE (NOT AT ARMC): D DIMER QUANT: 3.1 ug{FEU}/mL — AB (ref 0.00–0.50)

## 2015-10-18 MED ORDER — ASPIRIN 325 MG PO TABS
325.0000 mg | ORAL_TABLET | Freq: Every day | ORAL | Status: DC
  Administered 2015-10-19: 325 mg via ORAL
  Filled 2015-10-18: qty 1

## 2015-10-18 MED ORDER — SODIUM CHLORIDE 0.9% FLUSH
3.0000 mL | Freq: Two times a day (BID) | INTRAVENOUS | Status: DC
  Administered 2015-10-19 (×2): 3 mL via INTRAVENOUS

## 2015-10-18 MED ORDER — SIMVASTATIN 20 MG PO TABS
20.0000 mg | ORAL_TABLET | Freq: Every day | ORAL | Status: DC
  Administered 2015-10-19: 20 mg via ORAL
  Filled 2015-10-18: qty 1

## 2015-10-18 MED ORDER — OLANZAPINE 5 MG PO TABS
5.0000 mg | ORAL_TABLET | Freq: Every day | ORAL | Status: DC
  Administered 2015-10-19: 5 mg via ORAL
  Filled 2015-10-18 (×2): qty 1

## 2015-10-18 MED ORDER — TAMSULOSIN HCL 0.4 MG PO CAPS
0.4000 mg | ORAL_CAPSULE | Freq: Every day | ORAL | Status: DC
  Administered 2015-10-19: 0.4 mg via ORAL
  Filled 2015-10-18: qty 1

## 2015-10-18 MED ORDER — SODIUM CHLORIDE 0.9 % IV SOLN: 250.0000 mL | INTRAVENOUS | Status: DC | PRN

## 2015-10-18 MED ORDER — LEVOFLOXACIN 750 MG PO TABS
750.0000 mg | ORAL_TABLET | Freq: Once | ORAL | Status: AC
  Administered 2015-10-18: 750 mg via ORAL
  Filled 2015-10-18: qty 1

## 2015-10-18 MED ORDER — LEVOFLOXACIN 750 MG PO TABS: 750.0000 mg | ORAL_TABLET | ORAL | Status: DC

## 2015-10-18 MED ORDER — IOHEXOL 350 MG/ML SOLN
100.0000 mL | Freq: Once | INTRAVENOUS | Status: AC | PRN
  Administered 2015-10-18: 100 mL via INTRAVENOUS

## 2015-10-18 MED ORDER — SODIUM CHLORIDE 0.9% FLUSH: 3.0000 mL | INTRAVENOUS | Status: DC | PRN

## 2015-10-18 MED ORDER — CITALOPRAM HYDROBROMIDE 40 MG PO TABS
40.0000 mg | ORAL_TABLET | Freq: Every day | ORAL | Status: DC
  Administered 2015-10-19: 40 mg via ORAL
  Filled 2015-10-18 (×2): qty 1

## 2015-10-18 MED ORDER — ENOXAPARIN SODIUM 80 MG/0.8ML ~~LOC~~ SOLN
80.0000 mg | Freq: Two times a day (BID) | SUBCUTANEOUS | Status: DC
  Administered 2015-10-19: 80 mg via SUBCUTANEOUS
  Filled 2015-10-18: qty 0.8

## 2015-10-18 NOTE — ED Notes (Addendum)
Friday afternoon had a lot of intermittent pain in left chest with chills. Over the weekend the pain began more frequent until it wouldn't subside. States now he feels SOB, feels like he can't take a deep breath d/t the pain, didn't do anything to injure the chest wall. Small dry cough. Lungs clear in all fields. Vitals stable in triage.

## 2015-10-18 NOTE — ED Notes (Signed)
MD notified of D dimer results.  

## 2015-10-18 NOTE — ED Notes (Signed)
Lab to add on PTT and PTINR to blood already drawn.

## 2015-10-18 NOTE — ED Provider Notes (Signed)
CSN: [id number]     Arrival date & time 10/18/15  1826 History   First MD Initiated Contact with Patient 10/18/15 2020     Chief Complaint  Patient presents with  . Chest Pain  . Shortness of Breath    The history is provided by the patient. No language interpreter was used.   Bryan Lambert is a 60 y.o. male who presents to the Emergency Department complaining of chest pain, SOB. Symptoms started 2 days ago with left sided chest pain with chills. He never had a fever. He has associated shortness of breath and pain with deep breaths. He has no significant coughing. He feels like there is something wrong with his long. He denies any nausea, vomiting, abdominal pain, diarrhea, lower extremity swelling or pain. No significant past medical history, no history of recent injuries or surgeries. No history of PE.  Past Medical History  Diagnosis Date  . Hyperlipidemia   . Hematuria     asymptomatic   . Eczema   . Tobacco abuse   . Depression   . Personal history of colonic polyps 01/10/2012   Past Surgical History  Procedure Laterality Date  . Lumbar laminectomy  1975  . Inguinal hernia repair  1993    left  . Colonoscopy  multiple    polyps   Family History  Problem Relation Age of Onset  . Colon cancer Neg Hx   . Stomach cancer Neg Hx   . Heart disease Mother   . Kidney disease Mother   . Heart disease Father   . Breast cancer Maternal Aunt    Social History  Substance Use Topics  . Smoking status: Current Every Day Smoker -- 2.00 packs/day    Types: Cigarettes  . Smokeless tobacco: Never Used  . Alcohol Use: No    Review of Systems  All other systems reviewed and are negative.     Allergies  Food; Ibuprofen; and Red dye  Home Medications   Prior to Admission medications   Medication Sig Start Date End Date Taking? Authorizing Provider  aspirin 325 MG tablet Take 325 mg by mouth daily.     Yes Historical Provider, MD  citalopram (CELEXA) 40 MG tablet Take 40 mg  by mouth at bedtime.  10/15/15  Yes Historical Provider, MD  EPINEPHrine (EPIPEN) 0.3 mg/0.3 mL DEVI Inject 0.3 mLs (0.3 mg total) into the muscle as needed. 02/15/12  Yes Carmin Muskrat, MD  OLANZapine (ZYPREXA) 5 MG tablet Take 5 mg by mouth at bedtime.  10/15/15  Yes Historical Provider, MD  simvastatin (ZOCOR) 20 MG tablet take 1 tablet by mouth at bedtime Patient taking differently: Take 20 mg by mouth at bedtime.  10/22/13  Yes Dorena Cookey, MD  tamsulosin [facility]) 0.4 MG CAPS capsule take 1 capsule by mouth at bedtime Patient taking differently: Take 0.4 mg by mouth at bedtime 04/15/15  Yes Dorena Cookey, MD  citalopram (CELEXA) 20 MG tablet take 1 tablet by mouth once daily Patient not taking: Reported on 10/18/2015 07/06/14   Dorena Cookey, MD  clonazePAM (KLONOPIN) 1 MG tablet Take 1 tablet (1 mg total) by mouth 2 (two) times daily as needed for anxiety. Patient not taking: Reported on 10/18/2015 08/12/14   Dorena Cookey, MD  traZODone (DESYREL) 50 MG tablet 1 by mouth daily at bedtime Patient not taking: Reported on 10/18/2015 08/12/14   Dorena Cookey, MD   BP 123/81 mmHg  Pulse 59  Temp(Src) 98.2 F (  36.8 C) (Oral)  Resp 22  Ht 5\' 9"  (1.753 m)  Wt 182 lb 3.2 oz (82.645 kg)  BMI 26.89 kg/m2  SpO2 98% Physical Exam  Constitutional: He is oriented to person, place, and time. He appears well-developed and well-nourished.  HENT:  Head: Normocephalic and atraumatic.  Cardiovascular: Normal rate and regular rhythm.   No murmur heard. Pulmonary/Chest: Effort normal and breath sounds normal. No respiratory distress. He exhibits no tenderness.  Abdominal: Soft. There is no tenderness. There is no rebound and no guarding.  Musculoskeletal: He exhibits no edema or tenderness.  Neurological: He is alert and oriented to person, place, and time.  Skin: Skin is warm and dry. No rash noted.  Psychiatric: He has a normal mood and affect. His behavior is normal.  Nursing note and vitals  reviewed.   ED Course  Procedures (including critical care time) Labs Review Labs Reviewed  BASIC METABOLIC PANEL - Abnormal; Notable for the following:    Calcium 8.7 (*)    All other components within normal limits  CBC - Abnormal; Notable for the following:    WBC 12.4 (*)    All other components within normal limits  D-DIMER, QUANTITATIVE (NOT AT [facility]) - Abnormal; Notable for the following:    D-Dimer, Quant 3.10 (*)    All other components within normal limits  APTT  PROTIME-INR  HIV ANTIBODY (ROUTINE TESTING)  STREP PNEUMONIAE URINARY ANTIGEN  I-STAT TROPOININ, ED    Imaging Review Dg Chest 2 View  10/18/2015  CLINICAL DATA:  Left-sided chest pain for 2 days EXAM: CHEST  2 VIEW COMPARISON:  08/12/2014 FINDINGS: Cardiac shadow is within normal limits. Left basilar atelectasis/early infiltrate is noted. No effusion is seen. The bony structures are within normal limits. IMPRESSION: Left basilar changes. Electronically Signed   By: Inez Catalina M.D.   On: 10/18/2015 19:01   Ct Angio Chest Pe W/cm &/or Wo Cm  10/18/2015  CLINICAL DATA:  Left chest pain and chills beginning Friday. Now with shortness of breath. EXAM: CT ANGIOGRAPHY CHEST WITH CONTRAST TECHNIQUE: Multidetector CT imaging of the chest was performed using the standard protocol during bolus administration of intravenous contrast. Multiplanar CT image reconstructions and MIPs were obtained to evaluate the vascular anatomy. CONTRAST:  133mL OMNIPAQUE IOHEXOL 350 MG/ML SOLN COMPARISON:  None. FINDINGS: Technically adequate study with moderately good opacification of the central and segmental pulmonary arteries. Focal filling defects are demonstrated within lower lobe segmental and subsegmental pulmonary artery branches bilaterally. Appearance is consistent with acute pulmonary embolus. No evidence of right heart strain. Normal heart size. Normal caliber thoracic aorta. Great vessel origins are patent. Esophagus is decompressed.  No significant lymphadenopathy in the chest. Small left pleural effusion. Infiltration in the left lung base. This may indicate infarct or pneumonia. No pneumothorax. Azygos lobe. Airways appear patent with mild secretions demonstrated in the right mainstem bronchus. Included portions of the upper abdominal organs demonstrate a cyst in the lateral segment left lobe of the liver. Degenerative changes in the spine. Review of the MIP images confirms the above findings. IMPRESSION: Positive study for pulmonary emboli in the lower lobe pulmonary arteries. Infarct or pneumonia in the left lung base posteriorly with small left pleural effusion. These results were called by telephone at the time of interpretation on 10/18/2015 at 10:16 pm to Dr. Quintella Reichert , who verbally acknowledged these results. Electronically Signed   By: Lucienne Capers M.D.   On: 10/18/2015 22:19   I have personally reviewed and  evaluated these images and lab results as part of my medical decision-making.   EKG Interpretation   Date/Time:  Sunday October 18 2015 20:42:07 EDT Ventricular Rate:  67 PR Interval:  161 QRS Duration: 94 QT Interval:  381 QTC Calculation: 402 R Axis:   53 Text Interpretation:  Sinus rhythm Left atrial enlargement Confirmed by  Hazle Coca [id number]) on 10/18/2015 8:48:10 PM      MDM   Final diagnoses:  Other acute pulmonary embolism without acute cor pulmonale (HCC)   Patient here for evaluation of pleuritic chest pain. Lungs are clear examination but given his small infiltrate and leukocytosis treated for possible pneumonia with Levaquin. D-dimer obtained that was elevated, PE study obtained that demonstrated acute pulmonary embolism. Patient updated a findings studies and recommendation for admission for observation. He was treated with Lovenox for his PE. Hospitalist consulted for admission.    Quintella Reichert, MD 10/19/15 [phone]

## 2015-10-18 NOTE — ED Notes (Signed)
Lab adding on D-dimer to blood work already received.

## 2015-10-18 NOTE — H&P (Signed)
[facility] History and Physical  Bryan Lambert [id number] DOB: 08/19/1955 DOA: 10/18/2015  Referring physician: ED physician PCP: Joycelyn Man, MD  Specialists: Dr. Carlean Purl (GI)   Chief Complaint:  Chest pain, dyspnea   HPI: Bryan Lambert is a 60 y.o. male with PMH of major depressive disorder with past suicide attempts, BPH, colonic polyps, and ongoing tobacco abuse who presents to the ED with 2 days of intermittent left-sided chest pain, chills, and dyspnea. Symptoms started fairly acutely on 10/16/2015 and included intermittent pain in the left side of the chest and chills. Since that time, the pains have been more frequent and dyspnea has worsened. Patient describes the pain as severe, localized to the left chest, sharp, worse with cough or deep breath, and with no alleviating factors identified. He has never had similar symptoms previously. Patient denies any fevers, sick contacts, or long distance travel. There is no leg swelling or tenderness. He denies palpitations. There is no personal or family history of VTE and he does not use testosterone supplement.   In ED, patient was found to be afebrile, saturating well on room air, and with vital signs stable. Chemistry panel is unremarkable and CBC features a leukocytosis to 12,400. Chest x-ray features and opacity in the left base concerning for atelectasis versus infiltrate. Troponin was obtained and undetectable. EKG feature to sinus rhythm with suspected enlargement of the left atrium. D-dimer was sent off and returned elevated value of 3.10. CTA PE study was obtained and demonstrates lower lobe segmental and subsegmental PE bilaterally that appear to be acute. There is no evidence of right heart strain on this scan and the left basilar opacity noted on chest x-ray appears to represent an infarct versus pneumonia. Patient was started on treatment dose Lovenox and given a dose of empiric Levaquin for suspected CAP. Patient is  remained hemodynamically stable and will be admitted for ongoing evaluation and management of acute PE and suspected CAP.   Where does patient live?   At home    Can patient participate in ADLs?  Yes        Review of Systems:   General: no fevers, sweats, weight change, poor appetite, or fatigue. Chills.  HEENT: no blurry vision, hearing changes or sore throat Pulm: no cough or wheeze. Dyspnea CV: no palpitations. Lt-sided CP.  Abd: no nausea, vomiting, abdominal pain, diarrhea, or constipation GU: no dysuria, hematuria, increased urinary frequency, or urgency  Ext: no leg edema Neuro: no focal weakness, numbness, or tingling, no vision change or hearing loss Skin: no rash, no wounds MSK: No muscle spasm, no deformity, no red, hot, or swollen joint Heme: No easy bruising or bleeding Travel history: No recent long distant travel    Allergy:  Allergies  Allergen Reactions  . Food Other (See Comments)    ALMONDS  . Ibuprofen     REACTION: hives, anaphylactic shock ONLY THE RED COATING ON THE PILL!  Marland Kitchen Red Dye Swelling    Past Medical History  Diagnosis Date  . Hyperlipidemia   . Hematuria     asymptomatic   . Eczema   . Tobacco abuse   . Depression   . Personal history of colonic polyps 01/10/2012    Past Surgical History  Procedure Laterality Date  . Lumbar laminectomy  1975  . Inguinal hernia repair  1993    left  . Colonoscopy  multiple    polyps    Social History:  reports that he has been smoking Cigarettes.  He has been smoking about 2.00 packs per day. He has never used smokeless tobacco. He reports that he does not drink alcohol or use illicit drugs.  Family History:  Family History  Problem Relation Age of Onset  . Colon cancer Neg Hx   . Stomach cancer Neg Hx   . Heart disease Mother   . Kidney disease Mother   . Heart disease Father   . Breast cancer Maternal Aunt      Prior to Admission medications   Medication Sig Start Date End Date Taking?  Authorizing Provider  aspirin 325 MG tablet Take 325 mg by mouth daily.     Yes Historical Provider, MD  citalopram (CELEXA) 40 MG tablet Take 40 mg by mouth at bedtime.  10/15/15  Yes Historical Provider, MD  EPINEPHrine (EPIPEN) 0.3 mg/0.3 mL DEVI Inject 0.3 mLs (0.3 mg total) into the muscle as needed. 02/15/12  Yes Carmin Muskrat, MD  OLANZapine (ZYPREXA) 5 MG tablet Take 5 mg by mouth at bedtime.  10/15/15  Yes Historical Provider, MD  simvastatin (ZOCOR) 20 MG tablet take 1 tablet by mouth at bedtime Patient taking differently: Take 20 mg by mouth at bedtime.  10/22/13  Yes Dorena Cookey, MD  tamsulosin [facility]) 0.4 MG CAPS capsule take 1 capsule by mouth at bedtime Patient taking differently: Take 0.4 mg by mouth at bedtime 04/15/15  Yes Dorena Cookey, MD  citalopram (CELEXA) 20 MG tablet take 1 tablet by mouth once daily Patient not taking: Reported on 10/18/2015 07/06/14   Dorena Cookey, MD  clonazePAM (KLONOPIN) 1 MG tablet Take 1 tablet (1 mg total) by mouth 2 (two) times daily as needed for anxiety. Patient not taking: Reported on 10/18/2015 08/12/14   Dorena Cookey, MD  traZODone (DESYREL) 50 MG tablet 1 by mouth daily at bedtime Patient not taking: Reported on 10/18/2015 08/12/14   Dorena Cookey, MD    Physical Exam: Filed Vitals:   10/18/15 1835 10/18/15 2251  BP: 126/73   Pulse: 73   Temp: 98.3 F (36.8 C)   TempSrc: Oral   Resp: 20   Height:  5\' 9"  (1.753 m)  Weight:  82.645 kg (182 lb 3.2 oz)  SpO2: 95%    General: Not in acute distress. Anxious HEENT:       Eyes: PERRL, EOMI, no scleral icterus or conjunctival pallor.       ENT: No discharge from the ears or nose, no pharyngeal ulcers, petechiae or exudate, no tonsillar enlargement.        Neck: No JVD, no bruit, no appreciable mass Heme: No cervical adenopathy, no pallor Cardiac: S1/S2, RRR, No murmurs, No gallops or rubs. Pulm: Good air movement bilaterally. Bibasilar rales. Abd: Soft, nondistended, nontender, no  rebound pain or gaurding, no mass or organomegaly, BS present. Ext: No LE edema bilaterally. 2+DP/PT pulse bilaterally. Musculoskeletal: No gross deformity, no red, hot, swollen joints   Skin: No rashes or wounds on exposed surfaces  Neuro: Alert, oriented X3, cranial nerves II-XII grossly intact. No focal findings Psych: Patient is not overtly psychotic, denies suicidal or homocidal ideation, no active hallucinations.  Labs on Admission:  Basic Metabolic Panel:  Recent Labs Lab 10/18/15 1907  NA 139  K 4.0  CL 106  CO2 27  GLUCOSE 99  BUN 13  CREATININE 0.79  CALCIUM 8.7*   Liver Function Tests: No results for input(s): AST, ALT, ALKPHOS, BILITOT, PROT, ALBUMIN in the last 168 hours. No results for  input(s): LIPASE, AMYLASE in the last 168 hours. No results for input(s): AMMONIA in the last 168 hours. CBC:  Recent Labs Lab 10/18/15 1907  WBC 12.4*  HGB 15.1  HCT 43.6  MCV 92.6  PLT 207   Cardiac Enzymes: No results for input(s): CKTOTAL, CKMB, CKMBINDEX, TROPONINI in the last 168 hours.  BNP (last 3 results) No results for input(s): BNP in the last 8760 hours.  ProBNP (last 3 results) No results for input(s): PROBNP in the last 8760 hours.  CBG: No results for input(s): GLUCAP in the last 168 hours.  Radiological Exams on Admission: Dg Chest 2 View  10/18/2015  CLINICAL DATA:  Left-sided chest pain for 2 days EXAM: CHEST  2 VIEW COMPARISON:  08/12/2014 FINDINGS: Cardiac shadow is within normal limits. Left basilar atelectasis/early infiltrate is noted. No effusion is seen. The bony structures are within normal limits. IMPRESSION: Left basilar changes. Electronically Signed   By: Inez Catalina M.D.   On: 10/18/2015 19:01   Ct Angio Chest Pe W/cm &/or Wo Cm  10/18/2015  CLINICAL DATA:  Left chest pain and chills beginning Friday. Now with shortness of breath. EXAM: CT ANGIOGRAPHY CHEST WITH CONTRAST TECHNIQUE: Multidetector CT imaging of the chest was performed  using the standard protocol during bolus administration of intravenous contrast. Multiplanar CT image reconstructions and MIPs were obtained to evaluate the vascular anatomy. CONTRAST:  132mL OMNIPAQUE IOHEXOL 350 MG/ML SOLN COMPARISON:  None. FINDINGS: Technically adequate study with moderately good opacification of the central and segmental pulmonary arteries. Focal filling defects are demonstrated within lower lobe segmental and subsegmental pulmonary artery branches bilaterally. Appearance is consistent with acute pulmonary embolus. No evidence of right heart strain. Normal heart size. Normal caliber thoracic aorta. Great vessel origins are patent. Esophagus is decompressed. No significant lymphadenopathy in the chest. Small left pleural effusion. Infiltration in the left lung base. This may indicate infarct or pneumonia. No pneumothorax. Azygos lobe. Airways appear patent with mild secretions demonstrated in the right mainstem bronchus. Included portions of the upper abdominal organs demonstrate a cyst in the lateral segment left lobe of the liver. Degenerative changes in the spine. Review of the MIP images confirms the above findings. IMPRESSION: Positive study for pulmonary emboli in the lower lobe pulmonary arteries. Infarct or pneumonia in the left lung base posteriorly with small left pleural effusion. These results were called by telephone at the time of interpretation on 10/18/2015 at 10:16 pm to Dr. Quintella Reichert , who verbally acknowledged these results. Electronically Signed   By: Lucienne Capers M.D.   On: 10/18/2015 22:19    EKG: Independently reviewed.  Abnormal findings: Sinus rhythm, likely left atrial enlargement   Assessment/Plan  1. Acute pulmonary emboli  - CTA features b/l segmental and subsegmental PE without evidence of Rt heart strain  - Seemingly unprovoked  - Pt is active, a smoker, no known ca, no personal or FHx of VTE  - No leg edema or tenderness, no palpable cord  -  Has had multiple polypectomies, most recently in November '15, hyperplastic on path and advised to repeat colonoscopy in 2020  - Has had multiple skin biopsies for atypical nevi, none appeared malignant on path  - Reports PSA with each physical, has never been told it was high; does have BPH  - There is an opacity in the left base concerning for infarct vs PNA  - Treatment has been started with Lovenox  - Hemodynamics have been stable, TTE can be obtained if concern  remains for Rt heart strain   2. Tobacco abuse  - Ongoing  - Counseled toward cessation  - RN to provide smoking cessation information prior to discharge   3. Major depressive disorder, anxiety   - Chart notes describe multiple prior suicide attempts, most recently by Advil overdose  - Pt reports his mental health as "okay" right now, denies active HI, SI, or hallucinations  - Continue home-dose Celexa, Zyprexa, trazodone, Klonopin   4. CAP  - Left basilar opacity noted on CXR, CTA, concerning for pulmonary infarct vs PNA  - Difficult to distinguish at this time as leukocytosis could be from either  - Treating empirically with PO Levaquin  - Saturating high 90s on rm air   DVT ppx:  Lovenox per VTE treatment dosing  Code Status: Full code Family Communication:  Yes, patient's friend at bed side Disposition Plan: Admit to inpatient   Date of Service 10/18/2015    Vianne Bulls, MD [facility] Pager [phone]  If 7PM-7AM, please contact night-coverage [email or web address] Password [email or web address] 10/18/2015, 11:19 PM

## 2015-10-19 ENCOUNTER — Inpatient Hospital Stay (HOSPITAL_COMMUNITY): Payer: BLUE CROSS/BLUE SHIELD

## 2015-10-19 DIAGNOSIS — F17200 Nicotine dependence, unspecified, uncomplicated: Secondary | ICD-10-CM

## 2015-10-19 DIAGNOSIS — F332 Major depressive disorder, recurrent severe without psychotic features: Secondary | ICD-10-CM

## 2015-10-19 DIAGNOSIS — I2699 Other pulmonary embolism without acute cor pulmonale: Principal | ICD-10-CM

## 2015-10-19 DIAGNOSIS — N401 Enlarged prostate with lower urinary tract symptoms: Secondary | ICD-10-CM

## 2015-10-19 DIAGNOSIS — J189 Pneumonia, unspecified organism: Secondary | ICD-10-CM

## 2015-10-19 DIAGNOSIS — Z86010 Personal history of colonic polyps: Secondary | ICD-10-CM

## 2015-10-19 DIAGNOSIS — R351 Nocturia: Secondary | ICD-10-CM

## 2015-10-19 LAB — HIV ANTIBODY (ROUTINE TESTING W REFLEX): HIV SCREEN 4TH GENERATION: NONREACTIVE

## 2015-10-19 LAB — STREP PNEUMONIAE URINARY ANTIGEN: STREP PNEUMO URINARY ANTIGEN: NEGATIVE

## 2015-10-19 MED ORDER — ENOXAPARIN SODIUM 120 MG/0.8ML ~~LOC~~ SOLN
120.0000 mg | SUBCUTANEOUS | Status: DC
  Filled 2015-10-19: qty 0.8

## 2015-10-19 MED ORDER — APIXABAN 5 MG PO TABS: 5.0000 mg | ORAL_TABLET | Freq: Two times a day (BID) | ORAL | Status: DC

## 2015-10-19 MED ORDER — TRAMADOL HCL 50 MG PO TABS: 50.0000 mg | ORAL_TABLET | Freq: Four times a day (QID) | ORAL | Status: DC | PRN

## 2015-10-19 MED ORDER — NICOTINE 14 MG/24HR TD PT24: 14.0000 mg | MEDICATED_PATCH | Freq: Every day | TRANSDERMAL | Status: DC

## 2015-10-19 MED ORDER — APIXABAN 5 MG PO TABS
10.0000 mg | ORAL_TABLET | Freq: Two times a day (BID) | ORAL | Status: DC
  Administered 2015-10-19: 10 mg via ORAL
  Filled 2015-10-19: qty 2

## 2015-10-19 MED ORDER — APIXABAN 5 MG PO TABS: 10.0000 mg | ORAL_TABLET | Freq: Two times a day (BID) | ORAL | Status: DC

## 2015-10-19 MED ORDER — LEVOFLOXACIN 750 MG PO TABS: 750.0000 mg | ORAL_TABLET | ORAL | Status: AC

## 2015-10-19 MED ORDER — APIXABAN 5 MG PO TABS
5.0000 mg | ORAL_TABLET | Freq: Two times a day (BID) | ORAL | Status: DC
Start: 2015-10-26 — End: 2015-10-19

## 2015-10-19 NOTE — Discharge Instructions (Signed)
Information on my medicine - ELIQUIS (apixaban)  This medication education was reviewed with me or my healthcare representative as part of my discharge preparation.    Why was Eliquis prescribed for you? Eliquis was prescribed to treat blood clots that may have been found in the veins of your legs (deep vein thrombosis) or in your lungs (pulmonary embolism) and to reduce the risk of them occurring again.  What do You need to know about Eliquis ? The starting dose is 10 mg (two 5 mg tablets) taken TWICE daily for the FIRST SEVEN (7) DAYS, then on 10/26/15  the dose is reduced to ONE 5 mg tablet taken TWICE daily.  Eliquis may be taken with or without food.   Try to take the dose about the same time in the morning and in the evening. If you have difficulty swallowing the tablet whole please discuss with your pharmacist how to take the medication safely.  Take Eliquis exactly as prescribed and DO NOT stop taking Eliquis without talking to the doctor who prescribed the medication.  Stopping may increase your risk of developing a new blood clot.  Refill your prescription before you run out.  After discharge, you should have regular check-up appointments with your healthcare provider that is prescribing your Eliquis.    What do you do if you miss a dose? If a dose of ELIQUIS is not taken at the scheduled time, take it as soon as possible on the same day and twice-daily administration should be resumed. The dose should not be doubled to make up for a missed dose.  Important Safety Information A possible side effect of Eliquis is bleeding. You should call your healthcare provider right away if you experience any of the following: ? Bleeding from an injury or your nose that does not stop. ? Unusual colored urine (red or dark brown) or unusual colored stools (red or black). ? Unusual bruising for unknown reasons. ? A serious fall or if you hit your head (even if there is no bleeding).  Some  medicines may interact with Eliquis and might increase your risk of bleeding or clotting while on Eliquis. To help avoid this, consult your healthcare provider or pharmacist prior to using any new prescription or non-prescription medications, including herbals, vitamins, non-steroidal anti-inflammatory drugs (NSAIDs) and supplements.  This website has more information on Eliquis (apixaban): http://www.eliquis.com/eliquis/home

## 2015-10-19 NOTE — Progress Notes (Addendum)
Pharmacy: Re- lovenox  Patient is a 60 y.o M on lovenox for new PE. First dose of 80 mg (1 mg/kg) was given at at ~0100 this morning. No bleeding documented.  Plan: - change lovenox to 120 mg SQ q24h (~1.5 mg/kg/day) - cbc q72h - monitor for s/s bleeding  Dia Sitter, PharmD, BCPS 10/19/2015 10:15 AM   Adden: To transition Lovenox to Eliquis per MD's request - d/c lovenox  - Eliquis 10 mg PO BID for 7 days, then 5mg  BID Dia Sitter, PharmD, BCPS 10/19/2015 11:20 AM

## 2015-10-19 NOTE — Progress Notes (Signed)
ANTICOAGULATION CONSULT NOTE - Initial Consult  Pharmacy Consult for Enoxaparin Indication: pulmonary embolus  Allergies  Allergen Reactions  . Food Other (See Comments)    ALMONDS  . Ibuprofen     REACTION: hives, anaphylactic shock ONLY THE RED COATING ON THE PILL!  Marland Kitchen Red Dye Swelling    Patient Measurements: Height: 5\' 9"  (175.3 cm) Weight: 182 lb 3.2 oz (82.645 kg) IBW/kg (Calculated) : 70.7 Heparin Dosing Weight:   Vital Signs: Temp: 98.2 F (36.8 C) (03/20 0021) Temp Source: Oral (03/20 0021) BP: 123/81 mmHg (03/20 0021) Pulse Rate: 59 (03/20 0021)  Labs:  Recent Labs  10/18/15 1907  HGB 15.1  HCT 43.6  PLT 207  APTT 29  LABPROT 13.9  INR 1.05  CREATININE 0.79    Estimated Creatinine Clearance: 100.6 mL/min (by C-G formula based on Cr of 0.79).   Medical History: Past Medical History  Diagnosis Date  . Hyperlipidemia   . Hematuria     asymptomatic   . Eczema   . Tobacco abuse   . Depression   . Personal history of colonic polyps 01/10/2012    Medications:  Scheduled:  . aspirin  325 mg Oral Daily  . citalopram  40 mg Oral QHS  . enoxaparin (LOVENOX) injection  80 mg Subcutaneous BID  . levofloxacin  750 mg Oral Q24H  . OLANZapine  5 mg Oral QHS  . simvastatin  20 mg Oral QHS  . sodium chloride flush  3 mL Intravenous Q12H  . tamsulosin  0.4 mg Oral QHS    Assessment: Patient found to have PE in ED.  Baseline coags WNL.  Goal of Therapy:  Anti-Xa level 0.6-1 units/ml 4hrs after LMWH dose given Monitor platelets by anticoagulation protocol: Yes   Plan:  Enoxaparin 80mg  sq 12hr CBC q3 days  Tyler Deis, Shea Stakes [place] 10/19/2015,2:43 AM

## 2015-10-19 NOTE — Care Management Note (Signed)
Case Management Note  Patient Details  Name: Bryan Lambert MRN: [id number] Date of Birth: 11/04/1955  Subjective/Objective: 60 y/o m admitted w/PE. From home. Will check benefit for eliquis.                   Action/Plan:d/c plan home.   Expected Discharge Date:                  Expected Discharge Plan:  Home/Self Care  In-House Referral:     Discharge planning Services  CM Consult, Medication Assistance  Post Acute Care Choice:    Choice offered to:     DME Arranged:    DME Agency:     HH Arranged:    HH Agency:     Status of Service:  In process, will continue to follow  Medicare Important Message Given:    Date Medicare IM Given:    Medicare IM give by:    Date Additional Medicare IM Given:    Additional Medicare Important Message give by:     If discussed at [place] of Stay Meetings, dates discussed:    Additional Comments:  Dessa Phi, RN 10/19/2015, 1:45 PM

## 2015-10-19 NOTE — Progress Notes (Signed)
SATURATION QUALIFICATIONS: (This note is used to comply with regulatory documentation for home oxygen)  Patient Saturations on Room Air at Rest = 96%  Patient Saturations on Room Air while Ambulating = 96%  Patient Saturations on zero  Liters of oxygen while Ambulating = 96%  Please briefly explain why patient needs home oxygen:

## 2015-10-19 NOTE — Care Management Note (Signed)
Case Management Note  Patient Details  Name: Bryan Lambert MRN: [id number] Date of Birth: 1956-06-23  Subjective/Objective: Eliquis 30 day free trial offer given to patient-patient voiced understanding.                   Action/Plan:d/c home no further d/c needs.   Expected Discharge Date:                  Expected Discharge Plan:  Home/Self Care  In-House Referral:     Discharge planning Services  CM Consult, Medication Assistance  Post Acute Care Choice:    Choice offered to:     DME Arranged:    DME Agency:     HH Arranged:    [place] Agency:     Status of Service:  Completed, signed off  Medicare Important Message Given:    Date Medicare IM Given:    Medicare IM give by:    Date Additional Medicare IM Given:    Additional Medicare Important Message give by:     If discussed at [place] of Stay Meetings, dates discussed:    Additional Comments:  Dessa Phi, RN 10/19/2015, 4:23 PM

## 2015-10-19 NOTE — Progress Notes (Signed)
VASCULAR LAB PRELIMINARY  PRELIMINARY  PRELIMINARY  PRELIMINARY  Bilateral lower extremity venous duplex  completed.    Preliminary report:  Bilateral:  No evidence of DVT, superficial thrombosis, or Baker's Cyst.  Sluggish flow noted bilaterally in the CFV, FV, and Popliteal v.    Bunny Kleist, RVT 10/19/2015, 3:04 PM

## 2015-10-20 ENCOUNTER — Telehealth: Payer: Self-pay | Admitting: Family Medicine

## 2015-10-20 NOTE — Telephone Encounter (Signed)
Pt was discharge from [facility] and needs post hos follow up next week. Can I create 30 min slot?

## 2015-10-20 NOTE — Telephone Encounter (Signed)
Okay to work in

## 2015-10-20 NOTE — Telephone Encounter (Signed)
Pt called back and thought this said "ok to work" . Advised pt 3:30 on tues. If not ok please advise and I will call him back. Sorry about that.

## 2015-10-20 NOTE — Discharge Summary (Signed)
[facility] Discharge Summary   Patient: Bryan Lambert [id number]   PCP: Joycelyn Man, MD DOB: 1956-04-25   Date of admission: 10/18/2015   Date of discharge: 10/19/2015     Discharge Diagnoses:  Principal Problem:   Acute pulmonary embolism ([place]) Active Problems:   TOBACCO ABUSE   History of colonic polyps   BPH associated with nocturia   Severe recurrent major depression without psychotic features ([place])   CAP (community acquired pneumonia)   Pulmonary embolism without acute cor pulmonale ([place])   Recommendations for Outpatient Follow-up:  1. Follow-up with PCP in one week regarding further workup for hypercoagulability as well as follow-up on duration of anticoagulation  Follow-up Information    Follow up with TODD,JEFFREY ALLEN, MD. Schedule an appointment as soon as possible for a visit in 1 week.   Specialty:  Family Medicine   Contact information:   [address] [place] [address] [phone]      Diet recommendation: regular diet  Activity: The patient is advised to gradually reintroduce usual activities.  Discharge Condition: good  History of present illness: As per the H and P dictated on admission, "Bryan Lambert is a 60 y.o. male with PMH of major depressive disorder with past suicide attempts, BPH, colonic polyps, and ongoing tobacco abuse who presents to the ED with 2 days of intermittent left-sided chest pain, chills, and dyspnea. Symptoms started fairly acutely on 10/16/2015 and included intermittent pain in the left side of the chest and chills. Since that time, the pains have been more frequent and dyspnea has worsened. Patient describes the pain as severe, localized to the left chest, sharp, worse with cough or deep breath, and with no alleviating factors identified. He has never had similar symptoms previously. Patient denies any fevers, sick contacts, or long distance travel. There is no leg swelling or tenderness. He denies  palpitations. There is no personal or family history of VTE and he does not use testosterone supplement.   In ED, patient was found to be afebrile, saturating well on room air, and with vital signs stable. Chemistry panel is unremarkable and CBC features a leukocytosis to 12,400. Chest x-ray features and opacity in the left base concerning for atelectasis versus infiltrate. Troponin was obtained and undetectable. EKG feature to sinus rhythm with suspected enlargement of the left atrium. D-dimer was sent off and returned elevated value of 3.10. CTA PE study was obtained and demonstrates lower lobe segmental and subsegmental PE bilaterally that appear to be acute. There is no evidence of right heart strain on this scan and the left basilar opacity noted on chest x-ray appears to represent an infarct versus pneumonia. Patient was started on treatment dose Lovenox and given a dose of empiric Levaquin for suspected CAP. Patient is remained hemodynamically stable and will be admitted for ongoing evaluation and management of acute PE and suspected CAP.  "  Hospital Course:  Summary of his active problems in the hospital is as following. Principal Problem:   Acute pulmonary embolism ([place]) Probably unprovoked. No prior history of blood clots, no family history of blood clots, no recent travel history no recent immobilization history. No prior history of cancer. Switch to oral Eliquis. Patient will be recommended to follow-up with PCP as an outpatient to continue further workup regarding hypercoagulability. Lower extended Doppler negative for any DVT.  Active Problems:   TOBACCO ABUSE Recommended patient to quit smoking.    History of colonic polyps Recommended patient to follow-up with PCP  as well as follow-up with GI as needed for further workup.     BPH associated with nocturia Continue Flomax.    Severe recurrent major depression without psychotic features ([place]) Continue current  medications. While on on Levaquin recommend to hold Celexa for 2 days. Resume on Thursday.    CAP (community acquired pneumonia) Patient will be treated with Levaquin to complete a 5 day treatment course.   All other chronic medical condition were stable during the hospitalization.  Patient was ambulatory without any assistance. On the day of the discharge the patient's Vitals were stable, and no other acute medical condition were reported by patient. the patient was felt safe to be discharge at home with family.  Procedures and Results:  none   Consultations:  none  DISCHARGE MEDICATION: Discharge Medication List as of 10/19/2015  4:22 PM    START taking these medications   Details  !! apixaban (ELIQUIS) 5 MG TABS tablet Take 2 tablets (10 mg total) by mouth 2 (two) times daily., Starting 10/19/2015, Until Discontinued, Normal    !! apixaban (ELIQUIS) 5 MG TABS tablet Take 1 tablet (5 mg total) by mouth 2 (two) times daily., Starting 10/26/2015, Until Discontinued, Normal    levofloxacin (LEVAQUIN) 750 MG tablet Take 1 tablet (750 mg total) by mouth daily., Starting 10/19/2015, Until Thu 10/22/15, Normal    nicotine (NICODERM CQ - DOSED IN MG/24 HOURS) 14 mg/24hr patch Place 1 patch (14 mg total) onto the skin daily., Starting 10/19/2015, Until Discontinued, Normal     !! - Potential duplicate medications found. Please discuss with provider.    CONTINUE these medications which have NOT CHANGED   Details  citalopram (CELEXA) 40 MG tablet Take 40 mg by mouth at bedtime. , Starting 10/15/2015, Until Discontinued, Historical Med    EPINEPHrine (EPIPEN) 0.3 mg/0.3 mL DEVI Inject 0.3 mLs (0.3 mg total) into the muscle as needed., Starting 02/15/2012, Until Discontinued, Print    OLANZapine (ZYPREXA) 5 MG tablet Take 5 mg by mouth at bedtime. , Starting 10/15/2015, Until Discontinued, Historical Med    simvastatin (ZOCOR) 20 MG tablet take 1 tablet by mouth at bedtime, Normal     tamsulosin (FLOMAX) 0.4 MG CAPS capsule take 1 capsule by mouth at bedtime, Normal    clonazePAM (KLONOPIN) 1 MG tablet Take 1 tablet (1 mg total) by mouth 2 (two) times daily as needed for anxiety., Starting 08/12/2014, Until Discontinued, Print    traZODone (DESYREL) 50 MG tablet 1 by mouth daily at bedtime, Normal      STOP taking these medications     aspirin 325 MG tablet      traMADol (ULTRAM) 50 MG tablet        Allergies  Allergen Reactions  . Food Other (See Comments)    ALMONDS  . Ibuprofen     REACTION: hives, anaphylactic shock ONLY THE RED COATING ON THE PILL!  Marland Kitchen Red Dye Swelling   Discharge Instructions    Diet - low sodium heart healthy    Complete by:  As directed      Discharge instructions    Complete by:  As directed   It is important that you read following instructions as well as go over your medication list with RN to help you understand your care after this hospitalization.  Discharge Instructions: Please follow-up with PCP in one week, will need outpatient work up for hypercoagulable panel later.  Please request your primary care physician to go over all Hospital Tests and  Procedure/Radiological results at the follow up,  Please get all Hospital records sent to your PCP by signing hospital release before you go home.   Do not drive, operating heavy machinery, perform activities at heights, swimming or participation in water activities or provide baby sitting services while your are on Pain, Sleep and Anxiety Medications; until you have been seen by Primary Care Physician or a Neurologist and advised to do so again. Do not take more than prescribed Pain, Sleep and Anxiety Medications. You were cared for by a hospitalist during your hospital stay. If you have any questions about your discharge medications or the care you received while you were in the hospital after you are discharged, you can call the unit and ask to speak with the hospitalist on call if  the hospitalist that took care of you is not available.  Once you are discharged, your primary care physician will handle any further medical issues. Please note that NO REFILLS for any discharge medications will be authorized once you are discharged, as it is imperative that you return to your primary care physician (or establish a relationship with a primary care physician if you do not have one) for your aftercare needs so that they can reassess your need for medications and monitor your lab values. You Must read complete instructions/literature along with all the possible adverse reactions/side effects for all the Medicines you take and that have been prescribed to you. Take any new Medicines after you have completely understood and accept all the possible adverse reactions/side effects. Wear Seat belts while driving. If you have smoked or chewed Tobacco in the last 2 yrs please stop smoking and/or stop any Recreational drug use.     Increase activity slowly    Complete by:  As directed           Discharge Exam: Filed Weights   10/18/15 2251  Weight: 82.645 kg (182 lb 3.2 oz)   Filed Vitals:   10/19/15 1346 10/19/15 1353  BP: 114/73   Pulse: 66 61  Temp: 98.2 F (36.8 C)   Resp: 20    General: Appear in no distress, no Rash; Oral Mucosa moist. Cardiovascular: S1 and S2 Present, no Murmur, no JVD Respiratory: Bilateral Air entry present and basal Crackles, no wheezes Abdomen: Bowel Sound present, Soft and no tenderness Extremities: no Pedal edema, no calf tenderness Neurology: Grossly no focal neuro deficit.  The results of significant diagnostics from this hospitalization (including imaging, microbiology, ancillary and laboratory) are listed below for reference.    Significant Diagnostic Studies: Dg Chest 2 View  10/18/2015  CLINICAL DATA:  Left-sided chest pain for 2 days EXAM: CHEST  2 VIEW COMPARISON:  08/12/2014 FINDINGS: Cardiac shadow is within normal limits. Left  basilar atelectasis/early infiltrate is noted. No effusion is seen. The bony structures are within normal limits. IMPRESSION: Left basilar changes. Electronically Signed   By: Inez Catalina M.D.   On: 10/18/2015 19:01   Ct Angio Chest Pe W/cm &/or Wo Cm  10/18/2015  CLINICAL DATA:  Left chest pain and chills beginning Friday. Now with shortness of breath. EXAM: CT ANGIOGRAPHY CHEST WITH CONTRAST TECHNIQUE: Multidetector CT imaging of the chest was performed using the standard protocol during bolus administration of intravenous contrast. Multiplanar CT image reconstructions and MIPs were obtained to evaluate the vascular anatomy. CONTRAST:  184mL OMNIPAQUE IOHEXOL 350 MG/ML SOLN COMPARISON:  None. FINDINGS: Technically adequate study with moderately good opacification of the central and segmental pulmonary arteries. Focal filling  defects are demonstrated within lower lobe segmental and subsegmental pulmonary artery branches bilaterally. Appearance is consistent with acute pulmonary embolus. No evidence of right heart strain. Normal heart size. Normal caliber thoracic aorta. Great vessel origins are patent. Esophagus is decompressed. No significant lymphadenopathy in the chest. Small left pleural effusion. Infiltration in the left lung base. This may indicate infarct or pneumonia. No pneumothorax. Azygos lobe. Airways appear patent with mild secretions demonstrated in the right mainstem bronchus. Included portions of the upper abdominal organs demonstrate a cyst in the lateral segment left lobe of the liver. Degenerative changes in the spine. Review of the MIP images confirms the above findings. IMPRESSION: Positive study for pulmonary emboli in the lower lobe pulmonary arteries. Infarct or pneumonia in the left lung base posteriorly with small left pleural effusion. These results were called by telephone at the time of interpretation on 10/18/2015 at 10:16 pm to Dr. Quintella Reichert , who verbally acknowledged these  results. Electronically Signed   By: Lucienne Capers M.D.   On: 10/18/2015 22:19    Microbiology: No results found for this or any previous visit (from the past 240 hour(s)).   Labs: CBC:  Recent Labs Lab 10/18/15 1907  WBC 12.4*  HGB 15.1  HCT 43.6  MCV 92.6  PLT [id number]   Basic Metabolic Panel:  Recent Labs Lab 10/18/15 1907  NA 139  K 4.0  CL 106  CO2 27  GLUCOSE 99  BUN 13  CREATININE 0.79  CALCIUM 8.7*   Time spent: 30 minutes  Signed:  Tyrique Sporn  [facility] 10/19/2015 , 6:57 PM

## 2015-10-27 ENCOUNTER — Ambulatory Visit (INDEPENDENT_AMBULATORY_CARE_PROVIDER_SITE_OTHER): Payer: BLUE CROSS/BLUE SHIELD | Admitting: Family Medicine

## 2015-10-27 VITALS — BP 120/80 | Temp 98.7°F | Wt 181.0 lb

## 2015-10-27 DIAGNOSIS — F17200 Nicotine dependence, unspecified, uncomplicated: Secondary | ICD-10-CM | POA: Diagnosis not present

## 2015-10-27 DIAGNOSIS — I2699 Other pulmonary embolism without acute cor pulmonale: Secondary | ICD-10-CM | POA: Diagnosis not present

## 2015-10-27 MED ORDER — APIXABAN 5 MG PO TABS: 5.0000 mg | ORAL_TABLET | Freq: Two times a day (BID) | ORAL | Status: DC

## 2015-10-27 NOTE — Patient Instructions (Signed)
Continue the blood thinning tablets one twice daily........Marland Kitchen or if you prefer we can switch you to Coumadin........ for a total of 6 months.  Rachel's extension is [phone]  Call 2 weeks after he is taking her last dose next fall to come in for the hypercoagulability workup

## 2015-10-27 NOTE — Progress Notes (Signed)
Pre visit review using our clinic review tool, if applicable. No additional management support is needed unless otherwise documented below in the visit note. 

## 2015-10-27 NOTE — Progress Notes (Signed)
   Subjective:    Patient ID: Bryan Lambert, male    DOB: [birth year], 60 y.o.   MRN: [id number]  HPI Bryan Lambert is a 60 year old divorce male nonsmoker who comes in today for follow-up of a PE  He was admitted on 10/19/2015 with an acute PE  He presented with a three-day history of chest pain. He also had some fever and chills. Diagnostic study showed a PE on the left questionable pneumonia although it most likely the changes on his CT scan were from the PE noninfection ovary was given an antibiotic empirically also. He was started on Lovenox then switched to eloquent 5 mg twice a day. He says he feels well and has no complaints.  Historically we cannot see any event that triggered the PE. He's never had a history of PE. No family history of hypercoagulability.  He is a smoker    Review of Systems Review of systems otherwise negative    Objective:   Physical Exam  Well-developed well-nourished male no acute distress vital signs stable he is afebrile cardiopulmonary exam normal      Assessment & Plan:  Pulmonary embolus unprovoked........ anticoagulation for 6 months........ hypercoagulability studies to 3 weeks after off anticoagulation

## 2015-12-12 ENCOUNTER — Other Ambulatory Visit: Payer: Self-pay | Admitting: Family Medicine

## 2016-01-10 ENCOUNTER — Other Ambulatory Visit: Payer: Self-pay | Admitting: Family Medicine

## 2016-04-25 ENCOUNTER — Telehealth: Payer: Self-pay | Admitting: Family Medicine

## 2016-04-26 NOTE — Telephone Encounter (Signed)
error 

## 2016-05-11 ENCOUNTER — Other Ambulatory Visit (INDEPENDENT_AMBULATORY_CARE_PROVIDER_SITE_OTHER): Payer: BLUE CROSS/BLUE SHIELD

## 2016-05-11 DIAGNOSIS — Z0000 Encounter for general adult medical examination without abnormal findings: Secondary | ICD-10-CM

## 2016-05-11 LAB — POC URINALSYSI DIPSTICK (AUTOMATED)
Bilirubin, UA: NEGATIVE
GLUCOSE UA: NEGATIVE
Ketones, UA: NEGATIVE
Leukocytes, UA: NEGATIVE
Nitrite, UA: NEGATIVE
Protein, UA: NEGATIVE
SPEC GRAV UA: 1.02
UROBILINOGEN UA: 0.2
pH, UA: 6

## 2016-05-11 LAB — BASIC METABOLIC PANEL
BUN: 9 mg/dL (ref 6–23)
CHLORIDE: 106 meq/L (ref 96–112)
CO2: 28 meq/L (ref 19–32)
Calcium: 9.1 mg/dL (ref 8.4–10.5)
Creatinine, Ser: 0.88 mg/dL (ref 0.40–1.50)
GFR: 94.32 mL/min (ref >60.00)
Glucose, Bld: 93 mg/dL (ref 70–99)
POTASSIUM: 4.2 meq/L (ref 3.5–5.1)
SODIUM: 143 meq/L (ref 135–145)

## 2016-05-11 LAB — TSH: TSH: 1.48 u[IU]/mL (ref 0.35–4.50)

## 2016-05-11 LAB — LIPID PANEL
CHOL/HDL RATIO: 4
Cholesterol: 199 mg/dL (ref 0–200)
HDL: 51.8 mg/dL (ref >39.00)
LDL Cholesterol: 120 mg/dL — ABNORMAL HIGH (ref 0–99)
NONHDL: 146.77
Triglycerides: 134 mg/dL (ref 0.0–149.0)
VLDL: 26.8 mg/dL (ref 0.0–40.0)

## 2016-05-11 LAB — CBC WITH DIFFERENTIAL/PLATELET
BASOS ABS: 0 10*3/uL (ref 0.0–0.1)
Basophils Relative: 0.5 % (ref 0.0–3.0)
EOS ABS: 0.4 10*3/uL (ref 0.0–0.7)
Eosinophils Relative: 5.7 % — ABNORMAL HIGH (ref 0.0–5.0)
HCT: 48.8 % (ref 39.0–52.0)
Hemoglobin: 16.7 g/dL (ref 13.0–17.0)
LYMPHS ABS: 2 10*3/uL (ref 0.7–4.0)
Lymphocytes Relative: 26.1 % (ref 12.0–46.0)
MCHC: 34.3 g/dL (ref 30.0–36.0)
MCV: 93.8 fl (ref 78.0–100.0)
Monocytes Absolute: 0.4 10*3/uL (ref 0.1–1.0)
Monocytes Relative: 5.9 % (ref 3.0–12.0)
NEUTROS ABS: 4.7 10*3/uL (ref 1.4–7.7)
NEUTROS PCT: 61.8 % (ref 43.0–77.0)
PLATELETS: 226 10*3/uL (ref 150.0–400.0)
RBC: 5.2 Mil/uL (ref 4.22–5.81)
RDW: 13.4 % (ref 11.5–15.5)
WBC: 7.6 10*3/uL (ref 4.0–10.5)

## 2016-05-11 LAB — HEPATIC FUNCTION PANEL
ALBUMIN: 4 g/dL (ref 3.5–5.2)
ALK PHOS: 77 U/L (ref 39–117)
ALT: 24 U/L (ref 0–53)
AST: 24 U/L (ref 0–37)
BILIRUBIN DIRECT: 0.1 mg/dL (ref 0.0–0.3)
Total Bilirubin: 0.6 mg/dL (ref 0.2–1.2)
Total Protein: 6.6 g/dL (ref 6.0–8.3)

## 2016-05-11 LAB — PSA: PSA: 0.73 ng/mL (ref 0.10–4.00)

## 2016-05-16 ENCOUNTER — Encounter: Payer: Self-pay | Admitting: Family Medicine

## 2016-05-16 ENCOUNTER — Ambulatory Visit (INDEPENDENT_AMBULATORY_CARE_PROVIDER_SITE_OTHER): Payer: BLUE CROSS/BLUE SHIELD | Admitting: Family Medicine

## 2016-05-16 VITALS — BP 134/84 | HR 83 | Temp 98.1°F | Wt 185.6 lb

## 2016-05-16 DIAGNOSIS — E785 Hyperlipidemia, unspecified: Secondary | ICD-10-CM | POA: Diagnosis not present

## 2016-05-16 DIAGNOSIS — R351 Nocturia: Secondary | ICD-10-CM

## 2016-05-16 DIAGNOSIS — Z0000 Encounter for general adult medical examination without abnormal findings: Secondary | ICD-10-CM

## 2016-05-16 DIAGNOSIS — F17200 Nicotine dependence, unspecified, uncomplicated: Secondary | ICD-10-CM

## 2016-05-16 DIAGNOSIS — E7800 Pure hypercholesterolemia, unspecified: Secondary | ICD-10-CM | POA: Diagnosis not present

## 2016-05-16 DIAGNOSIS — N401 Enlarged prostate with lower urinary tract symptoms: Secondary | ICD-10-CM | POA: Diagnosis not present

## 2016-05-16 MED ORDER — SIMVASTATIN 20 MG PO TABS
20.0000 mg | ORAL_TABLET | Freq: Every day | ORAL | 3 refills | Status: AC
Start: 2016-05-16 — End: ?

## 2016-05-16 MED ORDER — VARENICLINE TARTRATE 0.5 MG PO TABS: ORAL_TABLET | ORAL | 10 refills | Status: AC

## 2016-05-16 MED ORDER — TAMSULOSIN HCL 0.4 MG PO CAPS: 0.4000 mg | ORAL_CAPSULE | Freq: Every day | ORAL | 3 refills | Status: AC

## 2016-05-16 NOTE — Progress Notes (Signed)
Pre visit review using our clinic review tool, if applicable. No additional management support is needed unless otherwise documented below in the visit note. 

## 2016-05-16 NOTE — Progress Notes (Signed)
Bryan Lambert is a 60 year old divorce male smoker....... one pack of cigarettes per day.... Who is always declined to try to quit smoking...Marland KitchenMarland KitchenMarland Kitchen who comes in today for general physical examination  He gets routine eye care, dental care, colonoscopy 2015 was normal.  He continues to smoke he sees taper down from 2 packs of cigarettes a day to 1. We've advised him the past on numerous occasions to stop smoking completely. I recommend he try the Chantix. He says he took it and couldn't sleep. We'll try to given on lower dose and given only in the morning.  He sees his psychiatrist on a regular basis because of a history of depression. Is on Celexa 40 mg daily and Zyprexa 5 mg at bedtime.  He did have a pulmonary embolus he was on a blood thinner for 6 months he stopped his September 17.  He takes Zocor 20 mg daily and an aspirin tablet for hyperlipidemia and Flomax 0.4 because of BPH  Tetanus booster 2010 he declines a flu shot  He is divorced he lives here in [place] works for the [employer] and does some marketing on the side.  14 point review of systems other than above are negative  Physical examination vital signs stable he is afebrile HEENT were negative neck was supple no adenopathy thyroid normal no carotid bruits cardiopulmonary exam normal abdominal exam normal genitalia normal circumcised male rectal normal stool guaiac-negative prostate normal extremities normal skin normal peripheral pulses normal  Impression #1 hyperlipidemia........ continue Zocor and aspirin and  #2 history of depression....... continue Celexa and Zyprexa follow-up by psychiatrist  #3 BPH.......... continue Flomax  #4 tobacco abuse........ again encouraged to taper and a trial of the Chantix.

## 2016-05-16 NOTE — Patient Instructions (Signed)
Try tapering by one per week and taking Chantix 0.5.............Marland Kitchen 1 daily in the morning  Restart an aspirin tablet with you Zocor  Follow-up in one year sooner if any problems

## 2016-05-18 ENCOUNTER — Other Ambulatory Visit: Payer: Self-pay

## 2016-05-24 ENCOUNTER — Encounter: Payer: Self-pay | Admitting: Family Medicine

## 2016-06-13 ENCOUNTER — Encounter (INDEPENDENT_AMBULATORY_CARE_PROVIDER_SITE_OTHER): Payer: Self-pay | Admitting: Sports Medicine

## 2016-06-13 ENCOUNTER — Ambulatory Visit: Payer: Self-pay

## 2016-06-13 ENCOUNTER — Ambulatory Visit (INDEPENDENT_AMBULATORY_CARE_PROVIDER_SITE_OTHER): Payer: Worker's Compensation | Admitting: Sports Medicine

## 2016-06-13 ENCOUNTER — Other Ambulatory Visit: Payer: Self-pay | Admitting: Occupational Medicine

## 2016-06-13 VITALS — BP 125/82 | HR 66 | Ht 69.0 in | Wt 185.0 lb

## 2016-06-13 DIAGNOSIS — M25532 Pain in left wrist: Secondary | ICD-10-CM

## 2016-06-13 DIAGNOSIS — M25531 Pain in right wrist: Secondary | ICD-10-CM

## 2016-06-13 DIAGNOSIS — M25431 Effusion, right wrist: Secondary | ICD-10-CM

## 2016-06-13 DIAGNOSIS — S52572A Other intraarticular fracture of lower end of left radius, initial encounter for closed fracture: Secondary | ICD-10-CM

## 2016-06-13 DIAGNOSIS — S62102A Fracture of unspecified carpal bone, left wrist, initial encounter for closed fracture: Secondary | ICD-10-CM

## 2016-06-13 NOTE — Patient Instructions (Signed)
Keep your splints on We are setting you up for a CT scan of your Right Wrist.

## 2016-06-13 NOTE — Progress Notes (Signed)
Bryan Lambert - 60 y.o. male MRN [id number]  Date of birth: 1955-09-09  Office Visit Note: Visit Date: 06/13/2016 PCP: Joycelyn Man, MD Referred by: Dorena Cookey, MD  Subjective: Chief Complaint  Patient presents with  . Left Wrist - Fracture  . Right Wrist - Injury  . Wrist Injury    Patient states left wirst is broken and right wirst is a bad sprain, but states that right wrist is in more pain than the left wrist.  Wearing Bilateral wrist brace.   HPI: Patient reports a crushing type injury tween a refrigerator wall with his hands in a hyperextended position earlier today. He had immediate onset of pain in bilateral wrists. He was seen at occupational medicine obtained x-rays as below. Referred here for further evaluation & management. Left wrist does have a confirmed fracture however his right wrist is more painful.  ROS: He denies any significant numbness tingling but is having severe pain. Pain is not out of proportion. He has taken some anti-inflammatories immediately following the incident. Has had a prescription for pain medication was unable to fill it due to the timing.  Currently wearing bilateral wrist braces.. Otherwise per HPI.  Assessment & Plan: Visit Diagnoses:  1. Wrist effusion, right   2. Pain in right wrist   3. Other closed intra-articular fracture of distal end of left radius, initial encounter   4. Wrist fracture, closed, left, initial encounter     Plan: Given the findings on the right wrist further evaluation with CT scan warranted. & Concern for potential occult scaphoid fracture versus possible nondisplaced distal radius fracture is there is a slight cortical irregularity on the oblique views of the wrist. We will go & placed in a thumb spica brace for the right wrist & have him continue with the cockup wrist brace for the left. He should be out of work until further notice. Smoking cessation encouraged. Pain medication previously provided recommend  that he fill this. Schedule anti-inflammatories. Ice elevation & rest recommended. Red flags reviewed.  Meds: No orders of the defined types were placed in this encounter.  Follow-up: Return for review of your CT scan results..   Clinical History: No specialty comments available.  He reports that he has been smoking Cigarettes.  He has been smoking about 2.00 packs per day. He has never used smokeless tobacco. No results for input(s): HGBA1C, LABURIC in the last 8760 hours.  Objective:  VS:  HT:5\' 9"  (175.3 cm)   WT:185 lb (83.9 kg)  BMI:27.4    BP:125/82  HR:66bpm  TEMP: ( )  RESP:  Physical Exam: Adult male. Uncomfortable but in no acute respiratory distress. Alert & appropriately interactive. Left wrist: Marked tenderness palpation along the distal radius. Pain with any type of ulnar deviation, radial deviation or palpation of the DRUJ. Grip strength is slightly diminished but intact muscle function. Good capillary refill. Radial pulses 1+/4 bilaterally. Right wrist focal TTP over the anatomic snuff box as well as distal radius. Pain with any type of wrist ulnar deviation radial deviation. Pain with motion of the thumb. Sensation is intact. Imaging: Dg Wrist Complete Left  Result Date: 06/13/2016 CLINICAL DATA:  Crush injury from refrigerator, bilateral wrist pain. EXAM: LEFT WRIST - COMPLETE 3+ VIEW COMPARISON:  None. FINDINGS: Acute nondisplaced intra-articular distal radial fracture without dislocation. No destructive bony lesions. Small bony fragment within the dorsum of the wrist. Dorsal wrist soft tissue swelling without subcutaneous gas or radiopaque foreign bodies. IMPRESSION: Acute nondisplaced  distal radial fracture. Suspected acute triquetrum fracture.  No dislocation. These results will be called to the ordering clinician or representative by the Radiologist Assistant, and communication documented in the PACS or zVision Dashboard. Electronically Signed   By: Elon Alas  M.D.   On: 06/13/2016 14:35   Dg Wrist Complete Right  Result Date: 06/13/2016 CLINICAL DATA:  60 year old male injured wrist.  Initial encounter. EXAM: RIGHT WRIST - COMPLETE 3+ VIEW COMPARISON:  None. FINDINGS: No fracture or dislocation. No scaphoid fracture detected. If there were persistent scaphoid region tenderness, then followup plain film examination in 7-10 days or MR could be obtained to exclude occult scaphoid injury. IMPRESSION: No fracture or dislocation. Electronically Signed   By: Genia Del M.D.   On: 06/13/2016 14:36

## 2016-06-14 ENCOUNTER — Telehealth (INDEPENDENT_AMBULATORY_CARE_PROVIDER_SITE_OTHER): Payer: Self-pay | Admitting: *Deleted

## 2016-06-14 NOTE — Telephone Encounter (Signed)
Faxed Office note and work note to [phone] attn [employer]

## 2016-06-14 NOTE — Telephone Encounter (Signed)
[facility] claim adjuster is requesting office notes for pt. Last visit. [phone] fax number [phone]

## 2016-06-22 ENCOUNTER — Ambulatory Visit
Admission: RE | Admit: 2016-06-22 | Discharge: 2016-06-22 | Disposition: A | Discharge status: Home / Self Care | Payer: Self-pay | Source: Ambulatory Visit | Attending: Sports Medicine | Admitting: Sports Medicine

## 2016-06-22 DIAGNOSIS — M25431 Effusion, right wrist: Secondary | ICD-10-CM

## 2016-06-27 ENCOUNTER — Ambulatory Visit (INDEPENDENT_AMBULATORY_CARE_PROVIDER_SITE_OTHER): Payer: Self-pay

## 2016-06-27 ENCOUNTER — Encounter (INDEPENDENT_AMBULATORY_CARE_PROVIDER_SITE_OTHER): Payer: Self-pay | Admitting: Sports Medicine

## 2016-06-27 ENCOUNTER — Ambulatory Visit (INDEPENDENT_AMBULATORY_CARE_PROVIDER_SITE_OTHER): Payer: Worker's Compensation | Admitting: Sports Medicine

## 2016-06-27 VITALS — BP 117/72 | HR 65 | Ht 69.0 in | Wt 185.0 lb

## 2016-06-27 DIAGNOSIS — S52572D Other intraarticular fracture of lower end of left radius, subsequent encounter for closed fracture with routine healing: Secondary | ICD-10-CM

## 2016-06-27 DIAGNOSIS — M25431 Effusion, right wrist: Secondary | ICD-10-CM

## 2016-06-27 DIAGNOSIS — S6991XA Unspecified injury of right wrist, hand and finger(s), initial encounter: Secondary | ICD-10-CM

## 2016-06-27 DIAGNOSIS — S62102A Fracture of unspecified carpal bone, left wrist, initial encounter for closed fracture: Secondary | ICD-10-CM | POA: Diagnosis not present

## 2016-06-27 DIAGNOSIS — S62101A Fracture of unspecified carpal bone, right wrist, initial encounter for closed fracture: Secondary | ICD-10-CM | POA: Diagnosis not present

## 2016-06-27 DIAGNOSIS — M25531 Pain in right wrist: Secondary | ICD-10-CM

## 2016-06-27 NOTE — Progress Notes (Signed)
Bryan Lambert - 60 y.o. male MRN [id number]  Date of birth: Sep 29, 1955  Office Visit Note: Visit Date: 06/27/2016 PCP: Joycelyn Man, MD Referred by: Dorena Cookey, MD  Subjective: Chief Complaint  Patient presents with  . Right Wrist - Follow-up  . Follow-up    Patient is here to discuss CT Scan results.   HPI: 2 weeks status post bilateral wrist injury while moving a refrigerator. Please see below for further injury information. He has been wearing a thumb spica brace on the right wrist & wrist immobilizer on the left. Reports the pain is significantly improved. He is here today to review CT scan of the right wrist. Not having to take any pain medications at this time. Remains out of work. ROS: Denies any significant numbness, tingling or pain out of proportion. Pain does not keep him awake at night. He reports 100% compliance with the braces as above.. Otherwise per HPI.   Clinical History: No specialty comments available.  He reports that he has been smoking Cigarettes.  He has been smoking about 2.00 packs per day. He has never used smokeless tobacco.  No results for input(s): HGBA1C, LABURIC in the last 8760 hours.  Assessment & Plan: Visit Diagnoses:  1. Wrist effusion, right   2. Pain in right wrist   3. Other closed intra-articular fracture of distal end of left radius with routine healing, subsequent encounter   4. Wrist fracture, closed, left, initial encounter   5. Right wrist fracture, closed, initial encounter   6. Scapholunate ligament injury with no instability, right, initial encounter    Plan:  For his left wrist we will continue with wrist immobilizer & recommend continued compliance with immobilization.  For his right wrist we will keep him in a thumb spica brace due to likely underlying scapholunate injury given the widening seen on CT scan. He will need 6 total weeks of immobilization for both of the above injuries.  Out of work note  provided. Smoking cessation once again encouraged.   Follow-up: Return in about 2 weeks (around 07/11/2016) for repeat X-rays.  Meds: No orders of the defined types were placed in this encounter.  Orders:  Orders Placed This Encounter  Procedures  . XR Wrist 2 Views Left    Procedures: No notes on file   Objective:  VS:  HT:5\' 9"  (175.3 cm)   WT:185 lb (83.9 kg)  BMI:27.4    BP:117/72  HR:65bpm  TEMP: ( )  RESP:  Physical Exam: Adult male. No acute distress. Alert & appropriate. Bilateral wrist & immobilization with a thumb spica brace & left-sided wrist immobilizer. Bilateral radial pulses are 2+/4. Grip strength is intact bilaterally. Radial nerve is functioning. Mild pain across the DRUJ bilaterally worse on the right than the left. No significant anatomic snuff box tenderness. Some pain over the scapholunate interval. Imaging: Dg Wrist Complete Left  Result Date: 06/13/2016 CLINICAL DATA:  Crush injury from refrigerator, bilateral wrist pain. EXAM: LEFT WRIST - COMPLETE 3+ VIEW COMPARISON:  None. FINDINGS: Acute nondisplaced intra-articular distal radial fracture without dislocation. No destructive bony lesions. Small bony fragment within the dorsum of the wrist. Dorsal wrist soft tissue swelling without subcutaneous gas or radiopaque foreign bodies. IMPRESSION: Acute nondisplaced distal radial fracture. Suspected acute triquetrum fracture.  No dislocation. These results will be called to the ordering clinician or representative by the Radiologist Assistant, and communication documented in the PACS or zVision Dashboard. Electronically Signed   By: Thana Farr.D.  On: 06/13/2016 14:35   Dg Wrist Complete Right  Result Date: 06/13/2016 CLINICAL DATA:  60 year old male injured wrist.  Initial encounter. EXAM: RIGHT WRIST - COMPLETE 3+ VIEW COMPARISON:  None. FINDINGS: No fracture or dislocation. No scaphoid fracture detected. If there were persistent scaphoid region  tenderness, then followup plain film examination in 7-10 days or MR could be obtained to exclude occult scaphoid injury. IMPRESSION: No fracture or dislocation. Electronically Signed   By: Genia Del M.D.   On: 06/13/2016 14:36   Ct Wrist Right Wo Contrast  Addendum Date: 06/27/2016   ADDENDUM REPORT: 06/27/2016 10:34 ADDENDUM: The scapholunate joint space is widened suggesting a scapholunate ligament injury. Electronically Signed   By: Marijo Sanes M.D.   On: 06/27/2016 10:34   Result Date: 06/27/2016 CLINICAL DATA:  Status post right wrist crush injury by a refrigerator 06/13/2016 with a distal radius fracture. Continued pain. Subsequent encounter. EXAM: CT OF THE RIGHT WRIST WITHOUT CONTRAST TECHNIQUE: Multidetector CT imaging of the right wrist was performed according to the standard protocol. Multiplanar CT image reconstructions were also generated. COMPARISON:  Plain films right wrist 06/13/2016. FINDINGS: Bones/Joint/Cartilage Again seen is a nondisplaced fracture of the distal radius. The fracture is oblique in orientation extending from the radial side of Lister's tubercle in a medial and volar orientation through the volar cortex of the radius just medial to the articular surface of the distal radioulnar joint. Although the fracture reaches the articular surface of the radiocarpal joint, there is no step-off or gap. No other fracture is identified. In particular, triquetrum is intact. Tiny exostosis off the dorsal margin of the capitate accounts for finding on prior plain film. Ulnar minus variance is noted. Ligaments Suboptimally assessed by CT. Muscles and Tendons Unremarkable. Soft tissues Unremarkable. IMPRESSION: Nondisplaced distal radius fracture is described above. No other fracture is identified. In particular, there is no fracture of the triquetrum as questioned on prior plain films. Ulnar minus variance. Electronically Signed: By: Inge Rise M.D. On: 06/22/2016 12:38   Xr  Wrist 2 Views Left  Result Date: 06/27/2016 Findings: Distal radius fracture involving intra-articular surfaces overall well aligned with no changes from prior films. Previous exostosis of the triquetrum is once again appreciated. Scapholunate interval is normal. Impression: Nondisplaced distal radius fracture, unchanged from prior films.     Past Medical/Family/Surgical/Social History: Medications & Allergies reviewed per EMR Patient Active Problem List   Diagnosis Date Noted  . Severe recurrent major depression without psychotic features ([place]) 10/05/2014  . Loss of weight 08/12/2014  . Night sweats 08/12/2014  . Sebaceous cyst 11/28/2013  . BPH associated with nocturia 10/22/2013  . Dysplastic nevus of trunk 05/08/2012  . Allergic reaction caused by a drug 04/17/2012  . History of colonic polyps 01/10/2012  . Hyperlipidemia 03/29/2007  . TOBACCO ABUSE 03/29/2007  . Hematuria 03/29/2007   Past Medical History:  Diagnosis Date  . Depression   . Eczema   . Hematuria    asymptomatic   . Hyperlipidemia   . Personal history of colonic polyps 01/10/2012  . Tobacco abuse    Family History  Problem Relation Age of Onset  . Colon cancer Neg Hx   . Stomach cancer Neg Hx   . Heart disease Mother   . Kidney disease Mother   . Heart disease Father   . Breast cancer Maternal Aunt    Past Surgical History:  Procedure Laterality Date  . COLONOSCOPY  multiple   polyps  . INGUINAL HERNIA REPAIR  1993   left  . LUMBAR LAMINECTOMY  1975   Social History   Occupational History  . Not on file.   Social History Main Topics  . Smoking status: Current Every Day Smoker    Packs/day: 2.00    Types: Cigarettes  . Smokeless tobacco: Never Used  . Alcohol use No  . Drug use: No  . Sexual activity: Not on file

## 2016-06-28 ENCOUNTER — Telehealth (INDEPENDENT_AMBULATORY_CARE_PROVIDER_SITE_OTHER): Payer: Self-pay | Admitting: *Deleted

## 2016-06-28 NOTE — Telephone Encounter (Signed)
Pt stated he needed letter and ct scan sent to his employer [employer] fax: [phone] attn: Gardner Candle. [phone]

## 2016-06-30 NOTE — Telephone Encounter (Signed)
I called spoke with patient to advise this has been taken care of this afternoon.

## 2016-06-30 NOTE — Telephone Encounter (Signed)
Faxed both per patient request to [employer] [phone]

## 2016-07-01 NOTE — Telephone Encounter (Signed)
Noted. Thank You.

## 2016-07-11 ENCOUNTER — Encounter (INDEPENDENT_AMBULATORY_CARE_PROVIDER_SITE_OTHER): Payer: Self-pay | Admitting: Sports Medicine

## 2016-07-11 ENCOUNTER — Ambulatory Visit (INDEPENDENT_AMBULATORY_CARE_PROVIDER_SITE_OTHER): Payer: Worker's Compensation | Admitting: Sports Medicine

## 2016-07-11 ENCOUNTER — Ambulatory Visit (INDEPENDENT_AMBULATORY_CARE_PROVIDER_SITE_OTHER): Payer: Worker's Compensation

## 2016-07-11 VITALS — BP 125/79 | HR 55 | Ht 69.0 in | Wt 185.0 lb

## 2016-07-11 DIAGNOSIS — M25532 Pain in left wrist: Secondary | ICD-10-CM

## 2016-07-11 DIAGNOSIS — M25531 Pain in right wrist: Secondary | ICD-10-CM

## 2016-07-11 DIAGNOSIS — S6991XA Unspecified injury of right wrist, hand and finger(s), initial encounter: Secondary | ICD-10-CM

## 2016-07-11 DIAGNOSIS — M25431 Effusion, right wrist: Secondary | ICD-10-CM

## 2016-07-11 DIAGNOSIS — S52572A Other intraarticular fracture of lower end of left radius, initial encounter for closed fracture: Secondary | ICD-10-CM

## 2016-07-11 DIAGNOSIS — S52572D Other intraarticular fracture of lower end of left radius, subsequent encounter for closed fracture with routine healing: Secondary | ICD-10-CM

## 2016-07-11 NOTE — Progress Notes (Addendum)
Bryan Lambert - 60 y.o. male MRN [id number]  Date of birth: [birth year]  Office Visit Note: Visit Date: 07/11/2016 PCP: Joycelyn Man, MD Referred by: Dorena Cookey, MD  Subjective: Chief Complaint  Patient presents with  . Right Wrist - Follow-up  . Left Wrist - Follow-up  . Follow-up    Patient states doing okay.  Wearing thumb spica on right wrist and wrist brace on left.   HPI: Patient has been wearing his braces with a standard wrist brace on the left & a thumb spica on the right. Overall having only minimal discomfort at this time. He has been compliant with wearing the braces. No significant pain. ROS: . Otherwise per HPI.   Clinical History: No specialty comments available.  He reports that he has been smoking Cigarettes.  He has been smoking about 2.00 packs per day. He has never used smokeless tobacco.  No results for input(s): HGBA1C, LABURIC in the last 8760 hours.  Assessment & Plan: Visit Diagnoses:    ICD-9-CM ICD-10-CM   1. Pain in left wrist 719.43 M25.532 XR Wrist Complete Left  2. Pain in right wrist 719.43 M25.531 XR Wrist Complete Right  3. Wrist effusion, right 719.03 M25.431   4. Other closed intra-articular fracture of distal end of left radius with routine healing, subsequent encounter V54.12 S52.572D   5. Scapholunate ligament injury with no instability, right, initial encounter 959.3 S69.91XA   6. Other closed intra-articular fracture of distal end of left radius, initial encounter 813.42 S52.572A     Plan: Overall he continues to progress quite well. I like for him to come out of the left wrist brace to work on gentle range of motion only intermittently otherwise she should continue to wear the brace. Her the right wrist given the likely scapholunate ligament injury would like for him to remain immobilized for an additional 2 weeks. We will plan to see him back in 2 weeks & repeat 4 view x-rays of the bilateral wrist with clenched fist  view. Follow-up: Return in about 11 days (around 07/22/2016) for repeat clinical exam, repeat X-rays.  Meds: No orders of the defined types were placed in this encounter.  Procedures: No notes on file   Objective:  VS:  HT:5\' 9"  (175.3 cm)   WT:185 lb (83.9 kg)  BMI:27.4    BP:125/79  HR:(!) 55bpm  TEMP: ( )  RESP:  Physical Exam:  Adult male. Alert and appropriate.  In no acute distress.  Upper extremities are overall well aligned with no significant deformity. No significant swelling.  Distal pulses 2+/4. No significant bruising/ecchymosis or erythema the skin RIGHT wrist: Overall well aligned. Minimal pain across the scapholunate interval & no focal bony tenderness of the distal radius. Grip strength is intact. Left wrist: Well aligned. No deformity. No pain along the distal radius or DRUJ. Grip strength is intact. Imaging: Dg Wrist Complete Left  Result Date: 06/13/2016 CLINICAL DATA:  Crush injury from refrigerator, bilateral wrist pain. EXAM: LEFT WRIST - COMPLETE 3+ VIEW COMPARISON:  None. FINDINGS: Acute nondisplaced intra-articular distal radial fracture without dislocation. No destructive bony lesions. Small bony fragment within the dorsum of the wrist. Dorsal wrist soft tissue swelling without subcutaneous gas or radiopaque foreign bodies. IMPRESSION: Acute nondisplaced distal radial fracture. Suspected acute triquetrum fracture.  No dislocation. These results will be called to the ordering clinician or representative by the Radiologist Assistant, and communication documented in the PACS or zVision Dashboard. Electronically Signed   By: Sandie Ano  Bloomer M.D.   On: 06/13/2016 14:35   Dg Wrist Complete Right  Result Date: 06/13/2016 CLINICAL DATA:  60 year old male injured wrist.  Initial encounter. EXAM: RIGHT WRIST - COMPLETE 3+ VIEW COMPARISON:  None. FINDINGS: No fracture or dislocation. No scaphoid fracture detected. If there were persistent scaphoid region tenderness, then  followup plain film examination in 7-10 days or MR could be obtained to exclude occult scaphoid injury. IMPRESSION: No fracture or dislocation. Electronically Signed   By: Genia Del M.D.   On: 06/13/2016 14:36   Ct Wrist Right Wo Contrast  Addendum Date: 06/27/2016   ADDENDUM REPORT: 06/27/2016 10:34 ADDENDUM: The scapholunate joint space is widened suggesting a scapholunate ligament injury. Electronically Signed   By: Marijo Sanes M.D.   On: 06/27/2016 10:34   Result Date: 06/27/2016 CLINICAL DATA:  Status post right wrist crush injury by a refrigerator 06/13/2016 with a distal radius fracture. Continued pain. Subsequent encounter. EXAM: CT OF THE RIGHT WRIST WITHOUT CONTRAST TECHNIQUE: Multidetector CT imaging of the right wrist was performed according to the standard protocol. Multiplanar CT image reconstructions were also generated. COMPARISON:  Plain films right wrist 06/13/2016. FINDINGS: Bones/Joint/Cartilage Again seen is a nondisplaced fracture of the distal radius. The fracture is oblique in orientation extending from the radial side of Lister's tubercle in a medial and volar orientation through the volar cortex of the radius just medial to the articular surface of the distal radioulnar joint. Although the fracture reaches the articular surface of the radiocarpal joint, there is no step-off or gap. No other fracture is identified. In particular, triquetrum is intact. Tiny exostosis off the dorsal margin of the capitate accounts for finding on prior plain film. Ulnar minus variance is noted. Ligaments Suboptimally assessed by CT. Muscles and Tendons Unremarkable. Soft tissues Unremarkable. IMPRESSION: Nondisplaced distal radius fracture is described above. No other fracture is identified. In particular, there is no fracture of the triquetrum as questioned on prior plain films. Ulnar minus variance. Electronically Signed: By: Inge Rise M.D. On: 06/22/2016 12:38   Xr Wrist Complete  Left  Result Date: 07/11/2016 Findings: 4V left wrist: Overall well aligned.  Fracture line involving the distal radius is showing early signs of callus formation. Overall well aligned in anatomic alignment.  No other secondary fracture appreciated  moderate degenerative change within the wrist & hand. Impression: Stable x-rays  Xr Wrist Complete Right  Result Date: 07/11/2016 Findings: 4V RIGHT wrist:: Overall well aligned.  Fracture line within the distal radius is more evident today. No worsening widening of the scapholunate interval. Otherwise no other acute fracture or dislocation. Impression: Stable x-rays from prior films.  Xr Wrist 2 Views Left  Result Date: 06/27/2016 Findings: Distal radius fracture involving intra-articular surfaces overall well aligned with no changes from prior films. Previous exostosis of the triquetrum is once again appreciated. Scapholunate interval is normal. Impression: Nondisplaced distal radius fracture, unchanged from prior films.   Past Medical/Family/Surgical/Social History: Medications & Allergies reviewed per EMR Patient Active Problem List   Diagnosis Date Noted  . Severe recurrent major depression without psychotic features ([place]) 10/05/2014  . Loss of weight 08/12/2014  . Night sweats 08/12/2014  . Sebaceous cyst 11/28/2013  . BPH associated with nocturia 10/22/2013  . Dysplastic nevus of trunk 05/08/2012  . Allergic reaction caused by a drug 04/17/2012  . History of colonic polyps 01/10/2012  . Hyperlipidemia 03/29/2007  . TOBACCO ABUSE 03/29/2007  . Hematuria 03/29/2007   Past Medical History:  Diagnosis Date  .  Depression   . Eczema   . Hematuria    asymptomatic   . Hyperlipidemia   . Personal history of colonic polyps 01/10/2012  . Tobacco abuse    Family History  Problem Relation Age of Onset  . Colon cancer Neg Hx   . Stomach cancer Neg Hx   . Heart disease Mother   . Kidney disease Mother   . Heart disease Father   .  Breast cancer Maternal Aunt    Past Surgical History:  Procedure Laterality Date  . COLONOSCOPY  multiple   polyps  . [place]   left  . LUMBAR LAMINECTOMY  1975   Social History   Occupational History  . Not on file.   Social History Main Topics  . Smoking status: Current Every Day Smoker    Packs/day: 2.00    Types: Cigarettes  . Smokeless tobacco: Never Used  . Alcohol use No  . Drug use: No  . Sexual activity: Not on file

## 2016-07-22 ENCOUNTER — Ambulatory Visit (INDEPENDENT_AMBULATORY_CARE_PROVIDER_SITE_OTHER): Payer: Worker's Compensation | Admitting: Sports Medicine

## 2016-07-22 ENCOUNTER — Ambulatory Visit (INDEPENDENT_AMBULATORY_CARE_PROVIDER_SITE_OTHER): Payer: Self-pay

## 2016-07-22 ENCOUNTER — Encounter (INDEPENDENT_AMBULATORY_CARE_PROVIDER_SITE_OTHER): Payer: Self-pay | Admitting: Sports Medicine

## 2016-07-22 ENCOUNTER — Ambulatory Visit (INDEPENDENT_AMBULATORY_CARE_PROVIDER_SITE_OTHER): Payer: Worker's Compensation

## 2016-07-22 VITALS — BP 121/74 | HR 53 | Ht 69.0 in | Wt 185.0 lb

## 2016-07-22 DIAGNOSIS — S62101D Fracture of unspecified carpal bone, right wrist, subsequent encounter for fracture with routine healing: Secondary | ICD-10-CM

## 2016-07-22 DIAGNOSIS — S52572D Other intraarticular fracture of lower end of left radius, subsequent encounter for closed fracture with routine healing: Secondary | ICD-10-CM

## 2016-07-22 DIAGNOSIS — S6991XA Unspecified injury of right wrist, hand and finger(s), initial encounter: Secondary | ICD-10-CM

## 2016-07-22 DIAGNOSIS — M25532 Pain in left wrist: Secondary | ICD-10-CM

## 2016-07-22 DIAGNOSIS — M25561 Pain in right knee: Secondary | ICD-10-CM

## 2016-07-22 DIAGNOSIS — M25531 Pain in right wrist: Secondary | ICD-10-CM

## 2016-07-22 NOTE — Progress Notes (Signed)
Bryan Lambert - 60 y.o. male MRN [id number]  Date of birth: [birth year]  Office Visit Note: Visit Date: 07/22/2016 PCP: Joycelyn Man, MD Referred by: Dorena Cookey, MD  Subjective: Chief Complaint  Patient presents with  . Right Wrist - Follow-up  . Left Wrist - Follow-up  . Follow-up    Patient states both wrist feel good, having stiffness and numbness in the back of right thumb.  Wearing wrist brace and thumb spica.   HPI: Patient reports overall doing well.  He isn't essentially no pain.  He has been wearing the right thumb spica splint consistently but has been removing the left splint as directed for working on range of motion.  He has continued to sleepiness. ROS: Otherwise per HPI.   Clinical History: No specialty comments available.  He reports that he has been smoking Cigarettes.  He has been smoking about 2.00 packs per day. He has never used smokeless tobacco.  No results for input(s): HGBA1C, LABURIC in the last 8760 hours.  Assessment & Plan: Visit Diagnoses:    ICD-9-CM ICD-10-CM   1. Pain in left wrist 719.43 M25.532 XR Wrist Complete Left  2. Pain in right wrist 719.43 M25.531 XR Wrist Complete Right  3. Other closed intra-articular fracture of distal end of left radius with routine healing, subsequent encounter V54.12 S52.572D   4. Scapholunate ligament injury with no instability, right, initial encounter 959.3 S69.91XA   5. Closed fracture of right wrist with routine healing, subsequent encounter V54.19 S62.101D     Plan: Overall he is progressing quite well.  I would like for him to continue with the right thumb spica splint for one additional week especially while sleeping but he can discontinue this and work on range of motion throughout the day.  He should avoid heavy lifting but will allow him to return to work in 2 weeks with shortened days and lifting restriction of 50 pounds.  If he does not feel like he is able to return at this level he will call  prior to this date.  We'll have him follow-up in 4 weeks with Dr. Erlinda Hong for hopeful full return to activities and work. Follow-up: Return in about 4 weeks (around 08/19/2016) for with Dr. Erlinda Hong for repeat clnical exam and work release.  Meds: No orders of the defined types were placed in this encounter.  Procedures: No notes on file   Objective:  VS:  HT:5\' 9"  (175.3 cm)   WT:185 lb (83.9 kg)  BMI:27.4    BP:121/74  HR:(!) 53bpm  TEMP: ( )  RESP:  Physical Exam:  Adult male. Alert and appropriate.  In no acute distress.  Upper extremities are overall well aligned with no significant deformity. No significant swelling.  Distal pulses 2+/4. No significant bruising/ecchymosis or erythema the skin RIGHT wrist: Overall well aligned. Minimal pain across the scapholunate interval & no focal bony tenderness of the distal radius. Grip strength is intact. Left wrist: Well aligned. No deformity. No pain along the distal radius or DRUJ. Grip strength is intact. Imaging: Xr Wrist Complete Left  Result Date: 07/22/2016 Findings: 3V left wrist: Overall well aligned.  Fracture line involving the distal radius is showing early signs of callus formation. Overall well aligned in anatomic alignment.  No other secondary fracture appreciated  moderate degenerative change within the wrist & hand. Impression: Stable x-rays  Xr Wrist Complete Left  Result Date: 07/11/2016 Findings: 4V left wrist: Overall well aligned.  Fracture line involving the distal  radius is showing early signs of callus formation. Overall well aligned in anatomic alignment.  No other secondary fracture appreciated  moderate degenerative change within the wrist & hand. Impression: Stable x-rays  Xr Wrist Complete Right  Result Date: 07/22/2016 Findings: 3V RIGHT wrist:: Overall well aligned.  Fracture line within the distal radius stable. No worsening widening of the scapholunate interval measures 2.73mm. Otherwise no worsening/acute features  Impression: Stable x-rays from prior films.  Xr Wrist Complete Right  Result Date: 07/11/2016 Findings: 4V RIGHT wrist:: Overall well aligned.  Fracture line within the distal radius is more evident today. No worsening widening of the scapholunate interval. Otherwise no other acute fracture or dislocation. Impression: Stable x-rays from prior films.  Xr Wrist 2 Views Left  Result Date: 06/27/2016 Findings: Distal radius fracture involving intra-articular surfaces overall well aligned with no changes from prior films. Previous exostosis of the triquetrum is once again appreciated. Scapholunate interval is normal. Impression: Nondisplaced distal radius fracture, unchanged from prior films.   Past Medical/Family/Surgical/Social History: Medications & Allergies reviewed per EMR Patient Active Problem List   Diagnosis Date Noted  . Severe recurrent major depression without psychotic features ([place]) 10/05/2014  . Loss of weight 08/12/2014  . Night sweats 08/12/2014  . Sebaceous cyst 11/28/2013  . BPH associated with nocturia 10/22/2013  . Dysplastic nevus of trunk 05/08/2012  . Allergic reaction caused by a drug 04/17/2012  . History of colonic polyps 01/10/2012  . Hyperlipidemia 03/29/2007  . TOBACCO ABUSE 03/29/2007  . Hematuria 03/29/2007   Past Medical History:  Diagnosis Date  . Depression   . Eczema   . Hematuria    asymptomatic   . Hyperlipidemia   . Personal history of colonic polyps 01/10/2012  . Tobacco abuse    Family History  Problem Relation Age of Onset  . Colon cancer Neg Hx   . Stomach cancer Neg Hx   . Heart disease Mother   . Kidney disease Mother   . Heart disease Father   . Breast cancer Maternal Aunt    Past Surgical History:  Procedure Laterality Date  . COLONOSCOPY  multiple   polyps  . [place]   left  . LUMBAR LAMINECTOMY  1975   Social History   Occupational History  . Not on file.   Social History Main Topics  .  Smoking status: Current Every Day Smoker    Packs/day: 2.00    Types: Cigarettes  . Smokeless tobacco: Never Used  . Alcohol use No  . Drug use: No  . Sexual activity: Not on file

## 2016-08-18 ENCOUNTER — Encounter (INDEPENDENT_AMBULATORY_CARE_PROVIDER_SITE_OTHER): Payer: Self-pay

## 2016-08-18 ENCOUNTER — Ambulatory Visit (INDEPENDENT_AMBULATORY_CARE_PROVIDER_SITE_OTHER): Payer: Self-pay | Admitting: Orthopaedic Surgery

## 2016-08-19 ENCOUNTER — Ambulatory Visit (INDEPENDENT_AMBULATORY_CARE_PROVIDER_SITE_OTHER): Payer: Worker's Compensation | Admitting: Orthopaedic Surgery

## 2016-08-19 ENCOUNTER — Encounter (INDEPENDENT_AMBULATORY_CARE_PROVIDER_SITE_OTHER): Payer: Self-pay | Admitting: Orthopaedic Surgery

## 2016-08-19 DIAGNOSIS — M25531 Pain in right wrist: Secondary | ICD-10-CM

## 2016-08-19 DIAGNOSIS — M25532 Pain in left wrist: Secondary | ICD-10-CM

## 2016-08-19 NOTE — Progress Notes (Signed)
Patient follows up today for his bilateral wrist pain. He's been doing fine. He denies any significant pain and mainly just some mild pain that comes and goes. He is currently working part time. He is ready to go back to work. I reviewed x-rays from the previous visit which showed healed distal radius fracture. I do not appreciate any significant widening of the scapholunate interval. I think at this point I'm okay with him go back to work on Monday without restriction. Questions encouraged and answered. Follow-up as needed.

## 2017-12-30 DEATH — deceased

## 2018-05-12 IMAGING — CT CT WRIST*R* W/O CM
3 of 4 series · 11 of 33 positions shown, 13 images · non-contrast
Comparison: Plain films right wrist 06/13/2016.

ADDENDUM:
The scapholunate joint space is widened suggesting a scapholunate
ligament injury.
CLINICAL DATA: Status post right wrist crush injury by a
refrigerator 06/13/2016 with a distal radius fracture. Continued
pain. Subsequent encounter.

EXAM:
CT OF THE RIGHT WRIST WITHOUT CONTRAST
TECHNIQUE: Multidetector CT imaging of the right wrist was performed according
to the standard protocol. Multiplanar CT image reconstructions were
also generated.

[Series 5: thin soft · axial · 0.26mm/px · z∈[-28,+57]mm · 3 of 236 slices shown, 4 images]
[im 48/236  soft-tissue]
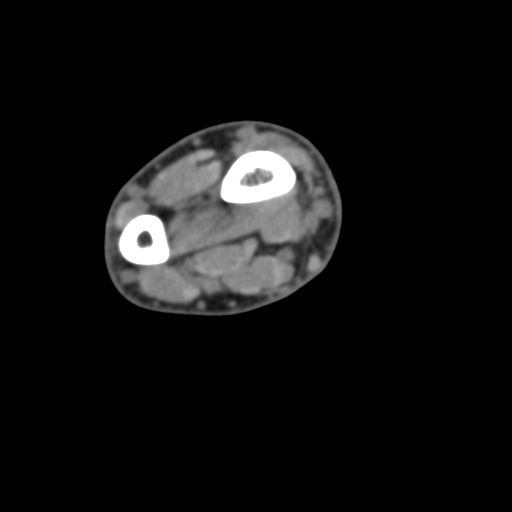
[im 48/236  bone]
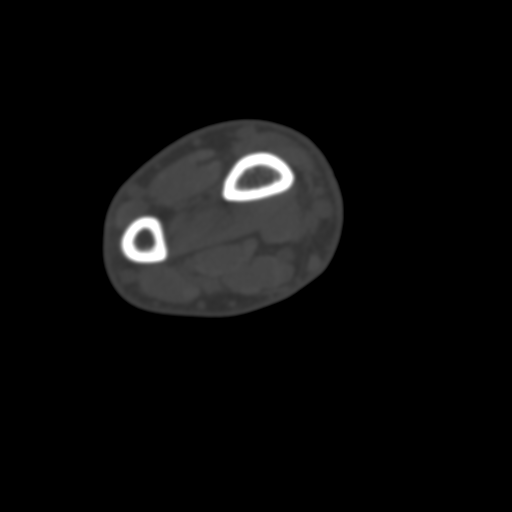
[im 118/236  bone]
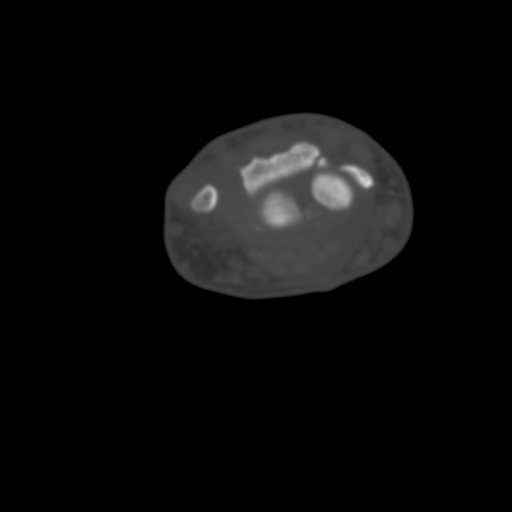
[im 189/236  bone]
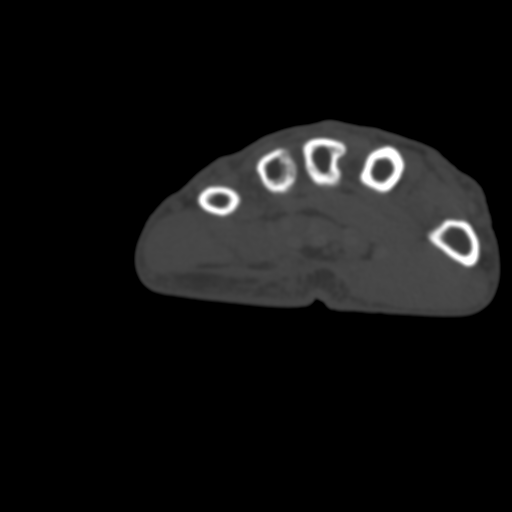

[Series 7: cor soft · coronal · 0.24mm/px · 3 of 14 slices shown]
[im 3/14  bone]
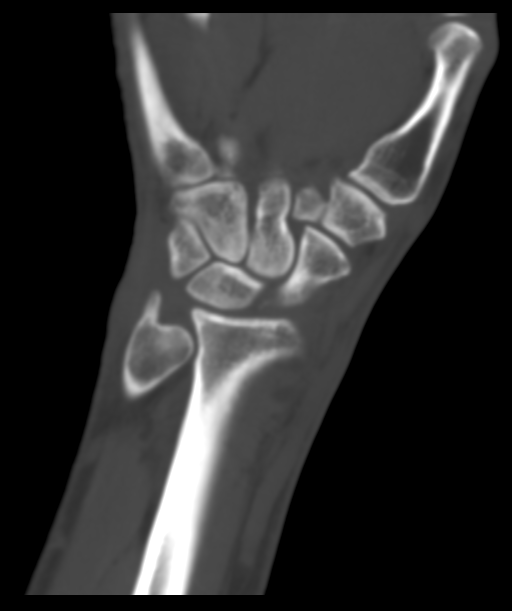
[im 6/14  bone]
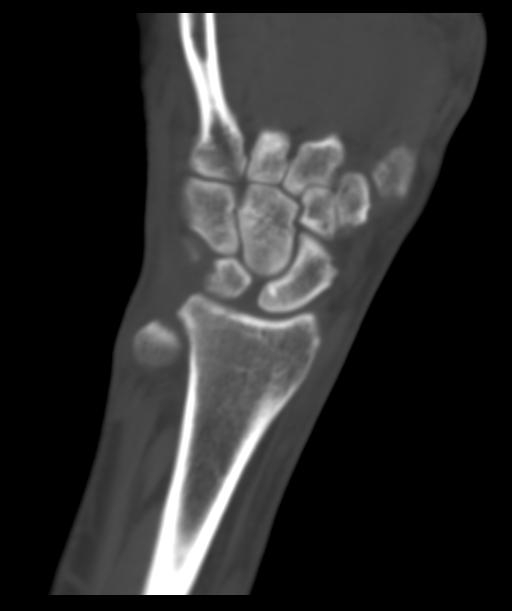
[im 8/14  bone]
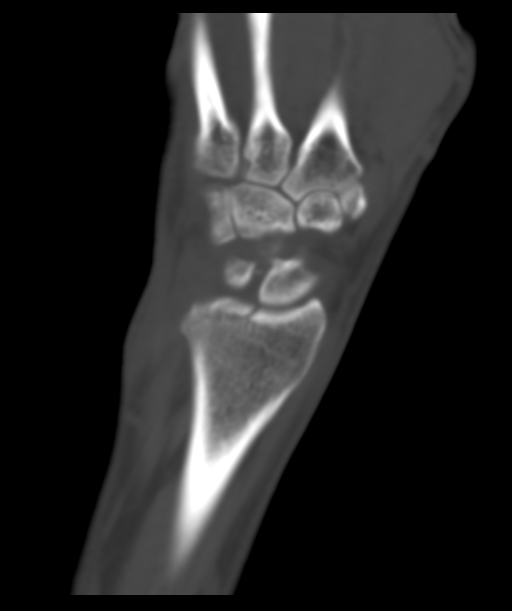

[Series 9: sag soft · sagittal · 0.14mm/px · 5 of 33 slices shown, 6 images]
[im 11/33  bone]
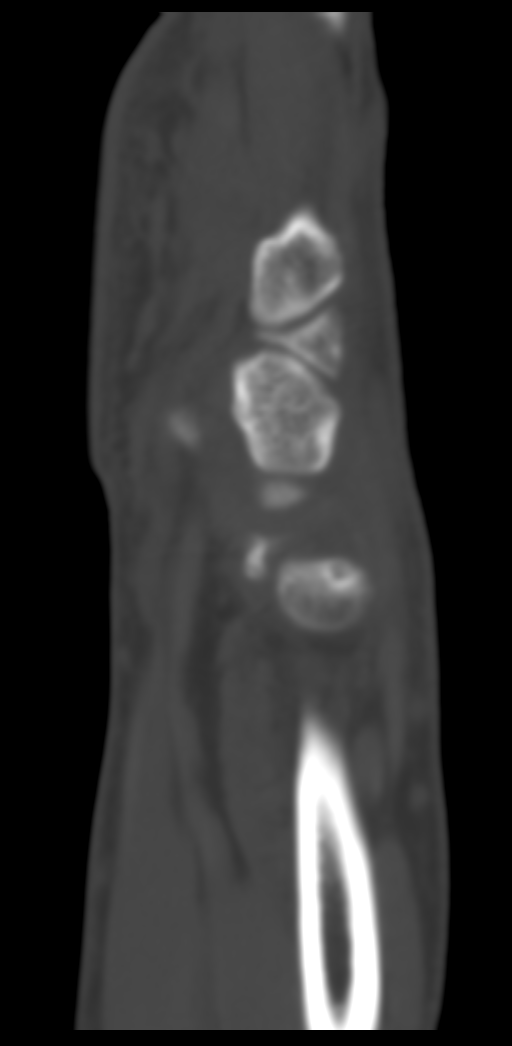
[im 14/33  bone]
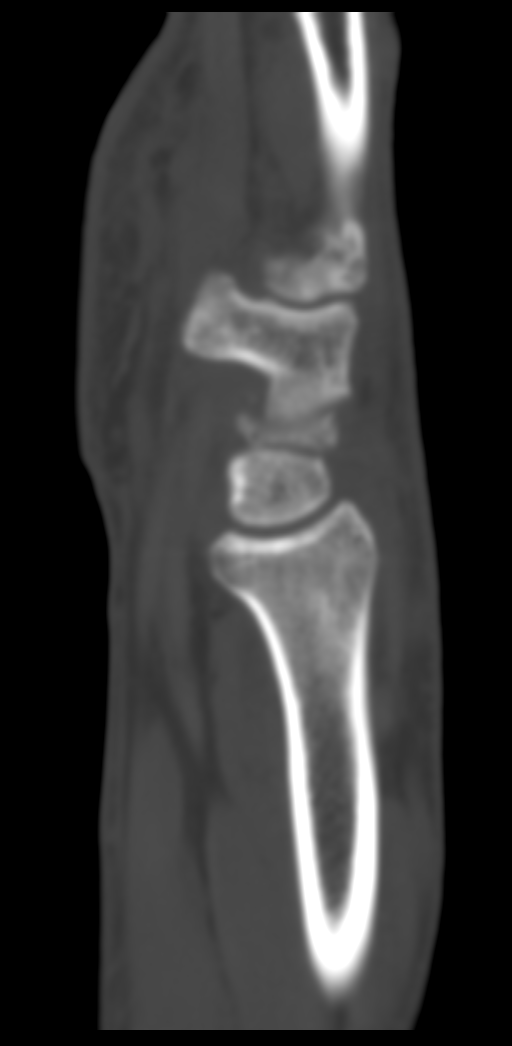
[im 17/33  soft-tissue]
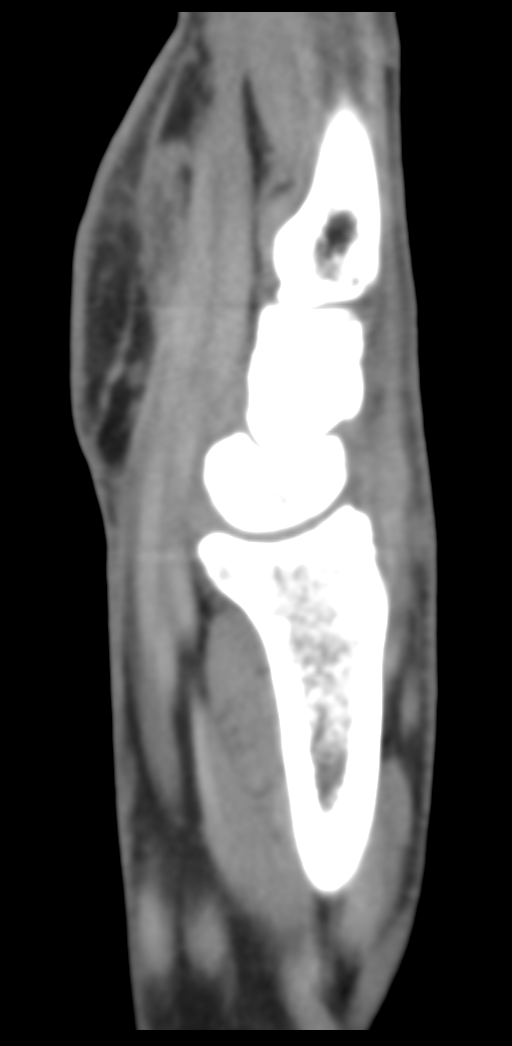
[im 17/33  bone]
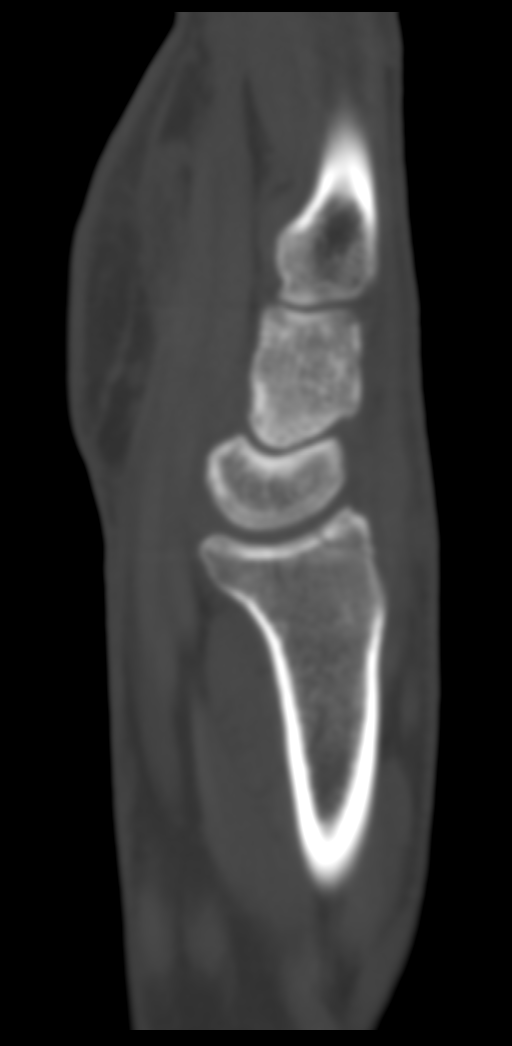
[im 19/33  bone]
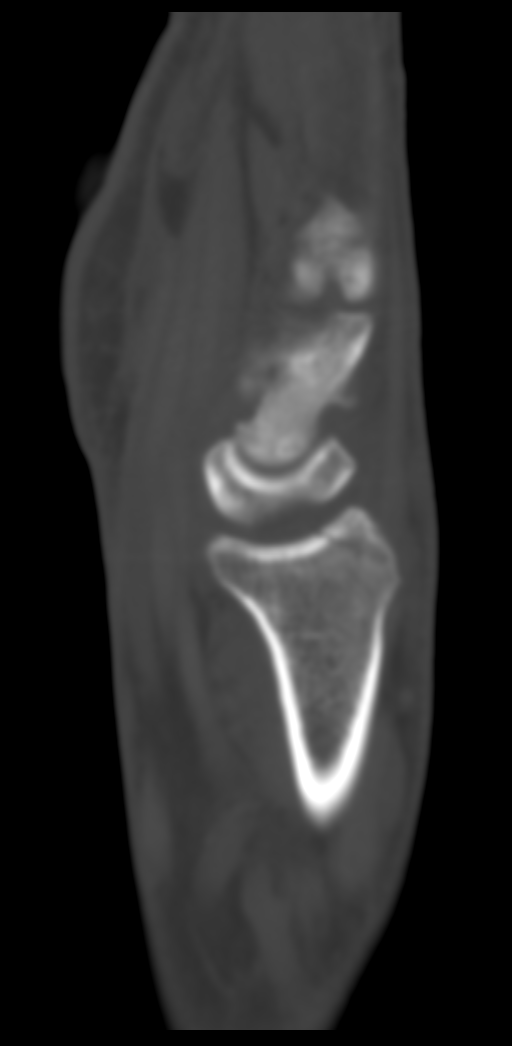
[im 22/33  bone]
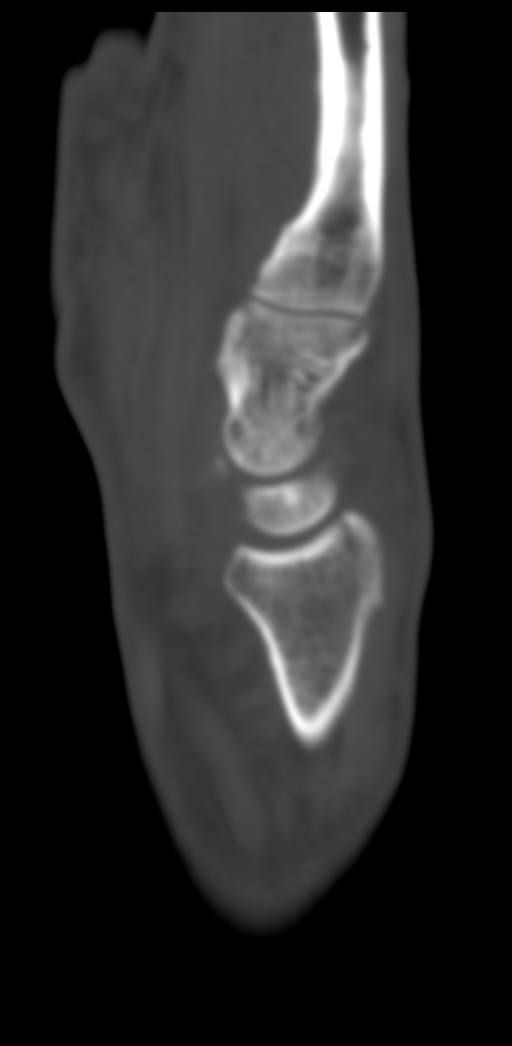

[11 of 33 positions shown; findings below may reference images not displayed]

FINDINGS: Bones/Joint/Cartilage

Again seen is a nondisplaced fracture of the distal radius. The
fracture is oblique in orientation extending from the radial side of
Lister's tubercle in a medial and volar orientation through the
volar cortex of the radius just medial to the articular surface of
the distal radioulnar joint. Although the fracture reaches the
articular surface of the radiocarpal joint, there is no step-off or
gap. No other fracture is identified. In particular, triquetrum is
intact. Tiny exostosis off the dorsal margin of the capitate
accounts for finding on prior plain film. Ulnar minus variance is
noted.

Ligaments

Suboptimally assessed by CT.

Muscles and Tendons

Unremarkable.

Soft tissues

Unremarkable.
IMPRESSION: Nondisplaced distal radius fracture is described above. No other
fracture is identified. In particular, there is no fracture of the
triquetrum as questioned on prior plain films.

Ulnar minus variance.
